# Patient Record
Sex: Female | Born: 1935 | Race: Black or African American | Hispanic: No | Marital: Single | Smoking: Former smoker
Health system: Southern US, Community
[De-identification: ages and names within clinical notes are randomized; demographics above are authoritative.]

## PROBLEM LIST (undated history)

## (undated) DIAGNOSIS — I509 Heart failure, unspecified: Secondary | ICD-10-CM

## (undated) DIAGNOSIS — I10 Essential (primary) hypertension: Secondary | ICD-10-CM

## (undated) HISTORY — PX: TONSILLECTOMY: SUR1361

## (undated) HISTORY — PX: ABDOMINAL HYSTERECTOMY: SHX81

---

## 2013-04-10 ENCOUNTER — Inpatient Hospital Stay (HOSPITAL_COMMUNITY)
Admission: EM | Admit: 2013-04-10 | Discharge: 2013-04-16 | DRG: 432 | Disposition: A | Discharge status: Hospice | Payer: Medicare Other | Attending: Family Medicine | Admitting: Family Medicine

## 2013-04-10 ENCOUNTER — Encounter (HOSPITAL_COMMUNITY): Payer: Self-pay | Admitting: Emergency Medicine

## 2013-04-10 DIAGNOSIS — G9340 Encephalopathy, unspecified: Secondary | ICD-10-CM

## 2013-04-10 DIAGNOSIS — I4891 Unspecified atrial fibrillation: Secondary | ICD-10-CM | POA: Diagnosis not present

## 2013-04-10 DIAGNOSIS — F29 Unspecified psychosis not due to a substance or known physiological condition: Secondary | ICD-10-CM | POA: Diagnosis present

## 2013-04-10 DIAGNOSIS — I4729 Other ventricular tachycardia: Secondary | ICD-10-CM | POA: Diagnosis not present

## 2013-04-10 DIAGNOSIS — I509 Heart failure, unspecified: Secondary | ICD-10-CM | POA: Diagnosis present

## 2013-04-10 DIAGNOSIS — M129 Arthropathy, unspecified: Secondary | ICD-10-CM | POA: Diagnosis present

## 2013-04-10 DIAGNOSIS — Z515 Encounter for palliative care: Secondary | ICD-10-CM

## 2013-04-10 DIAGNOSIS — R627 Adult failure to thrive: Secondary | ICD-10-CM | POA: Diagnosis present

## 2013-04-10 DIAGNOSIS — I959 Hypotension, unspecified: Secondary | ICD-10-CM

## 2013-04-10 DIAGNOSIS — K7460 Unspecified cirrhosis of liver: Principal | ICD-10-CM | POA: Diagnosis present

## 2013-04-10 DIAGNOSIS — K7682 Hepatic encephalopathy: Secondary | ICD-10-CM | POA: Diagnosis present

## 2013-04-10 DIAGNOSIS — E860 Dehydration: Secondary | ICD-10-CM | POA: Diagnosis present

## 2013-04-10 DIAGNOSIS — R109 Unspecified abdominal pain: Secondary | ICD-10-CM

## 2013-04-10 DIAGNOSIS — K56609 Unspecified intestinal obstruction, unspecified as to partial versus complete obstruction: Secondary | ICD-10-CM

## 2013-04-10 DIAGNOSIS — E872 Acidosis, unspecified: Secondary | ICD-10-CM | POA: Diagnosis present

## 2013-04-10 DIAGNOSIS — N179 Acute kidney failure, unspecified: Secondary | ICD-10-CM | POA: Diagnosis present

## 2013-04-10 DIAGNOSIS — D649 Anemia, unspecified: Secondary | ICD-10-CM | POA: Diagnosis present

## 2013-04-10 DIAGNOSIS — Z66 Do not resuscitate: Secondary | ICD-10-CM | POA: Diagnosis not present

## 2013-04-10 DIAGNOSIS — K7290 Hepatic failure, unspecified without coma: Secondary | ICD-10-CM | POA: Diagnosis present

## 2013-04-10 DIAGNOSIS — N189 Chronic kidney disease, unspecified: Secondary | ICD-10-CM | POA: Diagnosis present

## 2013-04-10 DIAGNOSIS — K560 Paralytic ileus: Secondary | ICD-10-CM | POA: Diagnosis present

## 2013-04-10 DIAGNOSIS — Z7401 Bed confinement status: Secondary | ICD-10-CM

## 2013-04-10 DIAGNOSIS — Z79899 Other long term (current) drug therapy: Secondary | ICD-10-CM

## 2013-04-10 DIAGNOSIS — K567 Ileus, unspecified: Secondary | ICD-10-CM

## 2013-04-10 DIAGNOSIS — Z95828 Presence of other vascular implants and grafts: Secondary | ICD-10-CM

## 2013-04-10 DIAGNOSIS — Z7902 Long term (current) use of antithrombotics/antiplatelets: Secondary | ICD-10-CM

## 2013-04-10 DIAGNOSIS — I472 Ventricular tachycardia, unspecified: Secondary | ICD-10-CM | POA: Diagnosis not present

## 2013-04-10 DIAGNOSIS — E8809 Other disorders of plasma-protein metabolism, not elsewhere classified: Secondary | ICD-10-CM | POA: Diagnosis present

## 2013-04-10 DIAGNOSIS — R32 Unspecified urinary incontinence: Secondary | ICD-10-CM | POA: Diagnosis present

## 2013-04-10 DIAGNOSIS — R188 Other ascites: Secondary | ICD-10-CM | POA: Diagnosis present

## 2013-04-10 DIAGNOSIS — E119 Type 2 diabetes mellitus without complications: Secondary | ICD-10-CM | POA: Diagnosis present

## 2013-04-10 DIAGNOSIS — I319 Disease of pericardium, unspecified: Secondary | ICD-10-CM | POA: Diagnosis present

## 2013-04-10 DIAGNOSIS — I129 Hypertensive chronic kidney disease with stage 1 through stage 4 chronic kidney disease, or unspecified chronic kidney disease: Secondary | ICD-10-CM | POA: Diagnosis present

## 2013-04-10 DIAGNOSIS — R739 Hyperglycemia, unspecified: Secondary | ICD-10-CM | POA: Diagnosis present

## 2013-04-10 HISTORY — DX: Heart failure, unspecified: I50.9

## 2013-04-10 HISTORY — DX: Essential (primary) hypertension: I10

## 2013-04-10 LAB — I-STAT TROPONIN, ED: Troponin i, poc: 0.02 ng/mL (ref 0.00–0.08)

## 2013-04-10 MED ORDER — FENTANYL CITRATE 0.05 MG/ML IJ SOLN
50.0000 ug | Freq: Once | INTRAMUSCULAR | Status: AC
  Administered 2013-04-10: 50 ug via INTRAVENOUS
  Filled 2013-04-10: qty 2

## 2013-04-10 MED ORDER — ONDANSETRON HCL 4 MG/2ML IJ SOLN
4.0000 mg | Freq: Once | INTRAMUSCULAR | Status: AC
  Administered 2013-04-10: 4 mg via INTRAVENOUS
  Filled 2013-04-10: qty 2

## 2013-04-10 NOTE — ED Notes (Signed)
EMS reports pt has had abd pain all day and now has had nausea and vomiting x 2 this evening. Pt c/o abd distention and pain rated at 3

## 2013-04-10 NOTE — ED Notes (Signed)
Pt unable to state month or day of week. Family states this is not normal for pt.  Also c/o urinary frequency.  Legs and lower legs have pitting edema.

## 2013-04-11 ENCOUNTER — Inpatient Hospital Stay (HOSPITAL_COMMUNITY): Payer: Medicare Other

## 2013-04-11 ENCOUNTER — Encounter (HOSPITAL_COMMUNITY): Payer: Self-pay | Admitting: *Deleted

## 2013-04-11 ENCOUNTER — Emergency Department (HOSPITAL_COMMUNITY): Payer: Medicare Other

## 2013-04-11 DIAGNOSIS — E872 Acidosis, unspecified: Secondary | ICD-10-CM | POA: Diagnosis present

## 2013-04-11 DIAGNOSIS — I509 Heart failure, unspecified: Secondary | ICD-10-CM

## 2013-04-11 DIAGNOSIS — K560 Paralytic ileus: Secondary | ICD-10-CM

## 2013-04-11 DIAGNOSIS — K56609 Unspecified intestinal obstruction, unspecified as to partial versus complete obstruction: Secondary | ICD-10-CM

## 2013-04-11 DIAGNOSIS — Z95828 Presence of other vascular implants and grafts: Secondary | ICD-10-CM

## 2013-04-11 DIAGNOSIS — D649 Anemia, unspecified: Secondary | ICD-10-CM | POA: Diagnosis present

## 2013-04-11 DIAGNOSIS — R739 Hyperglycemia, unspecified: Secondary | ICD-10-CM | POA: Diagnosis present

## 2013-04-11 DIAGNOSIS — G9340 Encephalopathy, unspecified: Secondary | ICD-10-CM | POA: Diagnosis present

## 2013-04-11 DIAGNOSIS — I319 Disease of pericardium, unspecified: Secondary | ICD-10-CM

## 2013-04-11 DIAGNOSIS — K567 Ileus, unspecified: Secondary | ICD-10-CM | POA: Diagnosis present

## 2013-04-11 DIAGNOSIS — I959 Hypotension, unspecified: Secondary | ICD-10-CM

## 2013-04-11 DIAGNOSIS — N179 Acute kidney failure, unspecified: Secondary | ICD-10-CM | POA: Diagnosis present

## 2013-04-11 DIAGNOSIS — K7460 Unspecified cirrhosis of liver: Secondary | ICD-10-CM | POA: Diagnosis present

## 2013-04-11 DIAGNOSIS — E860 Dehydration: Secondary | ICD-10-CM

## 2013-04-11 DIAGNOSIS — R109 Unspecified abdominal pain: Secondary | ICD-10-CM | POA: Diagnosis present

## 2013-04-11 LAB — PROTIME-INR
INR: 1.04 (ref 0.00–1.49)
Prothrombin Time: 13.4 seconds (ref 11.6–15.2)

## 2013-04-11 LAB — COMPREHENSIVE METABOLIC PANEL
ALBUMIN: 2.9 g/dL — AB (ref 3.5–5.2)
ALBUMIN: 1.8 g/dL — AB (ref 3.5–5.2)
ALK PHOS: 46 U/L (ref 39–117)
ALT: 6 U/L (ref 0–35)
ALT: 7 U/L (ref 0–35)
AST: 12 U/L (ref 0–37)
AST: 12 U/L (ref 0–37)
Alkaline Phosphatase: 62 U/L (ref 39–117)
BILIRUBIN TOTAL: 0.3 mg/dL (ref 0.3–1.2)
BUN: 44 mg/dL — ABNORMAL HIGH (ref 6–23)
BUN: 53 mg/dL — AB (ref 6–23)
CALCIUM: 8.5 mg/dL (ref 8.4–10.5)
CHLORIDE: 102 meq/L (ref 96–112)
CO2: 18 meq/L — AB (ref 19–32)
CO2: 20 mEq/L (ref 19–32)
CREATININE: 4.39 mg/dL — AB (ref 0.50–1.10)
Calcium: 9.5 mg/dL (ref 8.4–10.5)
Chloride: 104 mEq/L (ref 96–112)
Creatinine, Ser: 3.62 mg/dL — ABNORMAL HIGH (ref 0.50–1.10)
GFR calc Af Amer: 12 mL/min — ABNORMAL LOW (ref >90)
GFR calc Af Amer: 9 mL/min — ABNORMAL LOW (ref >90)
GFR calc non Af Amer: 8 mL/min — ABNORMAL LOW (ref >90)
GFR, EST NON AFRICAN AMERICAN: 10 mL/min — AB (ref >90)
Glucose, Bld: 191 mg/dL — ABNORMAL HIGH (ref 70–99)
Glucose, Bld: 155 mg/dL — ABNORMAL HIGH (ref 70–99)
Potassium: 4.2 mEq/L (ref 3.7–5.3)
Potassium: 5 mEq/L (ref 3.7–5.3)
SODIUM: 140 meq/L (ref 137–147)
Sodium: 141 mEq/L (ref 137–147)
TOTAL PROTEIN: 4.9 g/dL — AB (ref 6.0–8.3)
Total Bilirubin: <0.2 mg/dL — ABNORMAL LOW (ref 0.3–1.2)
Total Protein: 6.7 g/dL (ref 6.0–8.3)

## 2013-04-11 LAB — URINE MICROSCOPIC-ADD ON

## 2013-04-11 LAB — URINALYSIS, ROUTINE W REFLEX MICROSCOPIC
BILIRUBIN URINE: NEGATIVE
BILIRUBIN URINE: NEGATIVE
GLUCOSE, UA: 250 mg/dL — AB
Glucose, UA: 250 mg/dL — AB
KETONES UR: 15 mg/dL — AB
Ketones, ur: 15 mg/dL — AB
LEUKOCYTES UA: NEGATIVE
LEUKOCYTES UA: NEGATIVE
Nitrite: NEGATIVE
Nitrite: NEGATIVE
PH: 6 (ref 5.0–8.0)
PH: 6 (ref 5.0–8.0)
Protein, ur: >300 mg/dL — AB
Specific Gravity, Urine: 1.031 — ABNORMAL HIGH (ref 1.005–1.030)
Specific Gravity, Urine: 1.03 (ref 1.005–1.030)
Urobilinogen, UA: 0.2 mg/dL (ref 0.0–1.0)
Urobilinogen, UA: 0.2 mg/dL (ref 0.0–1.0)

## 2013-04-11 LAB — BODY FLUID CELL COUNT WITH DIFFERENTIAL
Eos, Fluid: 1 %
Lymphs, Fluid: 10 %
MONOCYTE-MACROPHAGE-SEROUS FLUID: 26 % — AB (ref 50–90)
Neutrophil Count, Fluid: 63 % — ABNORMAL HIGH (ref 0–25)
WBC FLUID: 38 uL (ref 0–1000)

## 2013-04-11 LAB — CBC
HCT: 30.3 % — ABNORMAL LOW (ref 36.0–46.0)
Hemoglobin: 10.5 g/dL — ABNORMAL LOW (ref 12.0–15.0)
MCH: 30.3 pg (ref 26.0–34.0)
MCHC: 34.7 g/dL (ref 30.0–36.0)
MCV: 87.3 fL (ref 78.0–100.0)
PLATELETS: 348 10*3/uL (ref 150–400)
RBC: 3.47 MIL/uL — ABNORMAL LOW (ref 3.87–5.11)
RDW: 14.4 % (ref 11.5–15.5)
WBC: 8.1 10*3/uL (ref 4.0–10.5)

## 2013-04-11 LAB — I-STAT CHEM 8, ED
BUN: 39 mg/dL — AB (ref 6–23)
CALCIUM ION: 1.18 mmol/L (ref 1.13–1.30)
CREATININE: 4.2 mg/dL — AB (ref 0.50–1.10)
Chloride: 108 mEq/L (ref 96–112)
GLUCOSE: 191 mg/dL — AB (ref 70–99)
HCT: 31 % — ABNORMAL LOW (ref 36.0–46.0)
Hemoglobin: 10.5 g/dL — ABNORMAL LOW (ref 12.0–15.0)
POTASSIUM: 4.1 meq/L (ref 3.7–5.3)
Sodium: 142 mEq/L (ref 137–147)
TCO2: 19 mmol/L (ref 0–100)

## 2013-04-11 LAB — PROTEIN, BODY FLUID: Total protein, fluid: 1.5 g/dL

## 2013-04-11 LAB — STREP PNEUMONIAE URINARY ANTIGEN: Strep Pneumo Urinary Antigen: NEGATIVE

## 2013-04-11 LAB — CREATININE, URINE, RANDOM: Creatinine, Urine: 139.21 mg/dL

## 2013-04-11 LAB — OSMOLALITY, URINE: Osmolality, Ur: 399 mOsm/kg (ref 390–1090)

## 2013-04-11 LAB — INFLUENZA PANEL BY PCR (TYPE A & B)
H1N1FLUPCR: NOT DETECTED
INFLBPCR: NEGATIVE
Influenza A By PCR: NEGATIVE

## 2013-04-11 LAB — GLUCOSE, CAPILLARY
GLUCOSE-CAPILLARY: 139 mg/dL — AB (ref 70–99)
GLUCOSE-CAPILLARY: 146 mg/dL — AB (ref 70–99)
Glucose-Capillary: 172 mg/dL — ABNORMAL HIGH (ref 70–99)
Glucose-Capillary: 143 mg/dL — ABNORMAL HIGH (ref 70–99)

## 2013-04-11 LAB — APTT: APTT: 26 s (ref 24–37)

## 2013-04-11 LAB — HEPATITIS PANEL, ACUTE
HCV Ab: NEGATIVE
HEP A IGM: NONREACTIVE
HEP B C IGM: NONREACTIVE
HEP B S AG: NEGATIVE

## 2013-04-11 LAB — I-STAT CG4 LACTIC ACID, ED: Lactic Acid, Venous: 1.32 mmol/L (ref 0.5–2.2)

## 2013-04-11 LAB — ALBUMIN, FLUID (OTHER): Albumin, Fluid: 0.8 g/dL

## 2013-04-11 LAB — SODIUM, URINE, RANDOM: SODIUM UR: 41 meq/L

## 2013-04-11 LAB — LACTATE DEHYDROGENASE, PLEURAL OR PERITONEAL FLUID: LD FL: 91 U/L — AB (ref 3–23)

## 2013-04-11 LAB — AMMONIA: Ammonia: 27 umol/L (ref 11–60)

## 2013-04-11 LAB — PRO B NATRIURETIC PEPTIDE: PRO B NATRI PEPTIDE: 4937 pg/mL — AB (ref 0–450)

## 2013-04-11 LAB — LIPASE, BLOOD: Lipase: 15 U/L (ref 11–59)

## 2013-04-11 LAB — OSMOLALITY: Osmolality: 313 mOsm/kg — ABNORMAL HIGH (ref 275–300)

## 2013-04-11 LAB — MRSA PCR SCREENING: MRSA BY PCR: NEGATIVE

## 2013-04-11 LAB — PROCALCITONIN: Procalcitonin: 1.44 ng/mL

## 2013-04-11 MED ORDER — SODIUM CHLORIDE 0.9 % IJ SOLN
3.0000 mL | Freq: Two times a day (BID) | INTRAMUSCULAR | Status: DC
  Administered 2013-04-11 – 2013-04-12 (×3): 3 mL via INTRAVENOUS
  Administered 2013-04-13: 10 mL via INTRAVENOUS
  Administered 2013-04-13 – 2013-04-14 (×2): 3 mL via INTRAVENOUS

## 2013-04-11 MED ORDER — SODIUM CHLORIDE 0.9 % IV SOLN
INTRAVENOUS | Status: DC
  Administered 2013-04-12: 10 mL/h via INTRAVENOUS

## 2013-04-11 MED ORDER — SODIUM CHLORIDE 0.9 % IV SOLN
INTRAVENOUS | Status: DC
  Administered 2013-04-11: 16:00:00 via INTRAVENOUS

## 2013-04-11 MED ORDER — SODIUM CHLORIDE 0.9 % IV BOLUS (SEPSIS)
500.0000 mL | Freq: Once | INTRAVENOUS | Status: AC
  Administered 2013-04-11: 500 mL via INTRAVENOUS

## 2013-04-11 MED ORDER — DEXTROSE 5 % IV SOLN
1.0000 g | INTRAVENOUS | Status: DC
  Administered 2013-04-11: 1 g via INTRAVENOUS
  Filled 2013-04-11 (×2): qty 10

## 2013-04-11 MED ORDER — ONDANSETRON HCL 4 MG/2ML IJ SOLN
4.0000 mg | Freq: Once | INTRAMUSCULAR | Status: AC
  Administered 2013-04-11: 4 mg via INTRAVENOUS

## 2013-04-11 MED ORDER — PANTOPRAZOLE SODIUM 40 MG IV SOLR
40.0000 mg | INTRAVENOUS | Status: DC
  Administered 2013-04-11 – 2013-04-14 (×4): 40 mg via INTRAVENOUS
  Filled 2013-04-11 (×3): qty 40

## 2013-04-11 MED ORDER — PANTOPRAZOLE SODIUM 40 MG IV SOLR: 40.0000 mg | Freq: Two times a day (BID) | INTRAVENOUS | Status: DC

## 2013-04-11 MED ORDER — INSULIN ASPART 100 UNIT/ML ~~LOC~~ SOLN: 0.0000 [IU] | Freq: Every day | SUBCUTANEOUS | Status: DC

## 2013-04-11 MED ORDER — INSULIN ASPART 100 UNIT/ML ~~LOC~~ SOLN
0.0000 [IU] | Freq: Three times a day (TID) | SUBCUTANEOUS | Status: DC
  Administered 2013-04-12 – 2013-04-13 (×4): 1 [IU] via SUBCUTANEOUS

## 2013-04-11 MED ORDER — IOHEXOL 300 MG/ML  SOLN
20.0000 mL | INTRAMUSCULAR | Status: AC
  Administered 2013-04-11: 25 mL via ORAL

## 2013-04-11 MED ORDER — ONDANSETRON HCL 4 MG/2ML IJ SOLN
INTRAMUSCULAR | Status: AC
  Administered 2013-04-11: 4 mg via INTRAVENOUS
  Filled 2013-04-11: qty 2

## 2013-04-11 MED ORDER — ONDANSETRON HCL 4 MG/2ML IJ SOLN
4.0000 mg | Freq: Four times a day (QID) | INTRAMUSCULAR | Status: DC | PRN
Start: 2013-04-11 — End: 2013-04-16
  Administered 2013-04-13: 4 mg via INTRAVENOUS
  Filled 2013-04-11: qty 2

## 2013-04-11 MED ORDER — LACTULOSE ENEMA
300.0000 mL | Freq: Two times a day (BID) | ORAL | Status: DC
  Administered 2013-04-11: 300 mL via RECTAL
  Filled 2013-04-11 (×3): qty 300

## 2013-04-11 MED ORDER — LACTULOSE 10 GM/15ML PO SOLN
30.0000 g | Freq: Three times a day (TID) | ORAL | Status: DC
  Administered 2013-04-11 (×3): 30 g via ORAL
  Filled 2013-04-11 (×6): qty 45

## 2013-04-11 MED ORDER — HEPARIN SODIUM (PORCINE) 5000 UNIT/ML IJ SOLN
5000.0000 [IU] | Freq: Three times a day (TID) | INTRAMUSCULAR | Status: DC
  Administered 2013-04-11 – 2013-04-14 (×10): 5000 [IU] via SUBCUTANEOUS
  Filled 2013-04-11 (×14): qty 1

## 2013-04-11 MED ORDER — ONDANSETRON HCL 4 MG PO TABS: 4.0000 mg | ORAL_TABLET | Freq: Four times a day (QID) | ORAL | Status: DC | PRN

## 2013-04-11 NOTE — ED Provider Notes (Signed)
CSN: [id number]     Arrival date & time 04/10/13  2214 History   First MD Initiated Contact with Patient 04/10/13 2301     Chief Complaint  Patient presents with  . Abdominal Pain     (Consider location/radiation/quality/duration/timing/severity/associated sxs/prior Treatment) HPI 80 year old female presents to emergency department from home with complaint of 2 days of abdominal pain, nausea and vomiting.  Patient reports pain is diffuse.  Family reports she has had confusion, and urinary incontinence, which is new.  She also has worsening swelling in her lower extremities.  She carries diagnoses of CHF.  Family reports swelling in legs axis, and wanes, and is currently worse than normal.  No fever, chills, sick contacts, unusual foods, or travel. Past Medical History  Diagnosis Date  . CHF (congestive heart failure)   . Hypertension    Past Surgical History  Procedure Laterality Date  . Abdominal hysterectomy    . Tonsillectomy     No family history on file. History  Substance Use Topics  . Smoking status: Never Smoker   . Smokeless tobacco: Not on file  . Alcohol Use: No   OB History   Grav Para Term Preterm Abortions TAB SAB Ect Mult Living                 Review of Systems  Unable to perform ROS: Mental status change      Allergies  Review of patient's allergies indicates no known allergies.  Home Medications   Current Outpatient Rx  Name  Route  Sig  Dispense  Refill  . amLODipine (NORVASC) 10 MG tablet   Oral   Take 10 mg by mouth daily.         . carvedilol (COREG) 3.125 MG tablet   Oral   Take 3.125 mg by mouth 2 (two) times daily with a meal.         . clopidogrel (PLAVIX) 75 MG tablet   Oral   Take 75 mg by mouth daily with breakfast.         . docusate sodium (COLACE) 100 MG capsule   Oral   Take 300 mg by mouth at bedtime.         . folic acid (FOLVITE) 1 MG tablet   Oral   Take 1 mg by mouth daily.         . isosorbide  dinitrate (ISORDIL) 20 MG tablet   Oral   Take 20 mg by mouth every 6 (six) hours. Hold for SBP <110 or HR <60         . pantoprazole (PROTONIX) 40 MG tablet   Oral   Take 40 mg by mouth daily.         Marland Kitchen senna (SENOKOT) 8.6 MG TABS tablet   Oral   Take 1 tablet by mouth at bedtime.         . vitamin B-12 (CYANOCOBALAMIN) 1000 MCG tablet   Oral   Take 1,000 mcg by mouth daily.          BP 201/90  Pulse 91  Temp(Src) 98 F (36.7 C) (Oral)  Resp 22  SpO2 98% Physical Exam  Nursing note and vitals reviewed. Constitutional: She is oriented to person, place, and time. She appears well-developed and well-nourished. She appears distressed.  HENT:  Head: Normocephalic and atraumatic.  Nose: Nose normal.  Mouth/Throat: Oropharynx is clear and moist.  Eyes: Conjunctivae and EOM are normal. Pupils are equal, round, and reactive to light.  Neck: Normal range of motion. Neck supple. No JVD present. No tracheal deviation present. No thyromegaly present.  Cardiovascular: Normal rate, regular rhythm, normal heart sounds and intact distal pulses.  Exam reveals no gallop and no friction rub.   No murmur heard. Pulmonary/Chest: Effort normal and breath sounds normal. No stridor. No respiratory distress. She has no wheezes. She has no rales. She exhibits no tenderness.  Abdominal: Soft. Bowel sounds are normal. She exhibits no distension and no mass. There is tenderness (diffuse tenderness worse suprapubic). There is no rebound and no guarding.  Musculoskeletal: Normal range of motion. She exhibits edema (2+ to mid shin). She exhibits no tenderness.  Lymphadenopathy:    She has no cervical adenopathy.  Neurological: She is alert and oriented to person, place, and time. She exhibits normal muscle tone. Coordination normal.  Skin: Skin is warm and dry. No rash noted. No erythema. No pallor.  Psychiatric: She has a normal mood and affect. Her behavior is normal. Judgment and thought content  normal.    ED Course  Procedures (including critical care time)  Pt with difficult to obtain blood. Multiple attempts by phlebotomy, nursing staff, techs.  Using sterile technique, right groin was prepped with alcohol and chlorhexidine.  Using 23 needle, blood was withdrawn from right femoral vein.  Pt tolerated procedure well.    CRITICAL CARE Performed by: Olivia Mackie Total critical care time: 60 min Critical care time was exclusive of separately billable procedures and treating other patients. Critical care was necessary to treat or prevent imminent or life-threatening deterioration. Critical care was time spent personally by me on the following activities: development of treatment plan with patient and/or surrogate as well as nursing, discussions with consultants, evaluation of patient's response to treatment, examination of patient, obtaining history from patient or surrogate, ordering and performing treatments and interventions, ordering and review of laboratory studies, ordering and review of radiographic studies, pulse oximetry and re-evaluation of patient's condition.  Labs Review Labs Reviewed  CBC - Abnormal; Notable for the following:    RBC 3.47 (*)    Hemoglobin 10.5 (*)    HCT 30.3 (*)    All other components within normal limits  COMPREHENSIVE METABOLIC PANEL - Abnormal; Notable for the following:    CO2 18 (*)    Glucose, Bld 191 (*)    BUN 44 (*)    Creatinine, Ser 3.62 (*)    Albumin 2.9 (*)    GFR calc non Af Amer 10 (*)    GFR calc Af Amer 12 (*)    All other components within normal limits  URINALYSIS, ROUTINE W REFLEX MICROSCOPIC - Abnormal; Notable for the following:    APPearance CLOUDY (*)    Specific Gravity, Urine 1.031 (*)    Glucose, UA 250 (*)    Hgb urine dipstick SMALL (*)    Ketones, ur 15 (*)    Protein, ur >300 (*)    All other components within normal limits  PRO B NATRIURETIC PEPTIDE - Abnormal; Notable for the following:    Pro B  Natriuretic peptide (BNP) 4937.0 (*)    All other components within normal limits  URINE MICROSCOPIC-ADD ON - Abnormal; Notable for the following:    Casts HYALINE CASTS (*)    All other components within normal limits  GLUCOSE, CAPILLARY - Abnormal; Notable for the following:    Glucose-Capillary 172 (*)    All other components within normal limits  I-STAT CHEM 8, ED - Abnormal; Notable for the  following:    BUN 39 (*)    Creatinine, Ser 4.20 (*)    Glucose, Bld 191 (*)    Hemoglobin 10.5 (*)    HCT 31.0 (*)    All other components within normal limits  MRSA PCR SCREENING  BODY FLUID CULTURE  LIPASE, BLOOD  AMMONIA  PROTIME-INR  APTT  BODY FLUID CELL COUNT WITH DIFFERENTIAL  PROTEIN, BODY FLUID  LACTATE DEHYDROGENASE, BODY FLUID  ALBUMIN, FLUID  SODIUM, URINE, RANDOM  OSMOLALITY, URINE  OSMOLALITY  I-STAT TROPOININ, ED  I-STAT CG4 LACTIC ACID, ED  CYTOLOGY - NON PAP   Imaging Review Ct Abdomen Pelvis Wo Contrast  04/11/2013   CLINICAL DATA:  Abdominal pain  EXAM: CT ABDOMEN AND PELVIS WITHOUT CONTRAST  TECHNIQUE: Multidetector CT imaging of the abdomen and pelvis was performed following the standard protocol without intravenous contrast.  COMPARISON:  None.  FINDINGS: Bilateral pleural effusions and bibasilar atelectasis left greater than right. Moderately large pericardial effusion. Small hiatal hernia.  Small irregular liver consistent with cirrhosis. Large amount of ascites consistent with liver failure. There is diffuse anasarca.  Small bowel is moderately dilated suggestive of small bowel obstruction. The colon is decompressed. Negative for adenopathy. IVC filter noted extending into the hepatic IVC.  IMPRESSION: Bilateral pleural effusions. Pericardial effusion. Large amount of ascites.  Cirrhosis of the liver.  Small bowel obstruction.   Electronically Signed   By: Marlan Palauharles  Clark M.D.   On: 04/11/2013 03:02   Ct Head Wo Contrast  04/11/2013   CLINICAL DATA:  Altered  mental status  EXAM: CT HEAD WITHOUT CONTRAST  TECHNIQUE: Contiguous axial images were obtained from the base of the skull through the vertex without intravenous contrast.  COMPARISON:  None.  FINDINGS: Mild atrophy. Negative for hydrocephalus. Mild chronic microvascular ischemic change in the white matter.  Negative for acute infarct.  Negative for hemorrhage or mass.  NG tube in place. Paranasal sinuses are clear. No acute skull abnormality.  IMPRESSION: No acute abnormality.   Electronically Signed   By: Marlan Palauharles  Clark M.D.   On: 04/11/2013 06:46     EKG Interpretation   Date/Time:  Saturday April 10 2013 22:28:15 EDT Ventricular Rate:  93 PR Interval:  172 QRS Duration: 86 QT Interval:  385 QTC Calculation: 479 R Axis:   91 Text Interpretation:  Sinus rhythm Anterior infarct, old No old tracing to  compare Confirmed by Zacari Radick  MD, Carolanne Mercier ([id number]) on 04/10/2013 11:33:49 PM      MDM   Final diagnoses:  SBO (small bowel obstruction)  Acute kidney failure  CHF (congestive heart failure)  Dehydration  Anemia  Cirrhosis of liver with ascites    80 year old female with 2 days of abdominal pain, nausea, vomiting.  Plan for labs, UA.  Suspect urinary tract infection.  Given age and difficulty obtaining history and unreliable exam.  Will need CT scan.  Pt with renal failure, family reports no prior knowledge of same.  Pt has been in [place] for the last 2 months, prior to that time had been in [place].  Area hospital does not have staff to allow us to receive records at this time.  Pt reportedly being seen by Dr Algie CofferKadakia.    CT scan showing cirrhosis, ascites, and SBO.  Case discussed with hospitalist, To be admitted to stepdown unit.   Olivia Mackielga M Nathania Waldman, MD 04/11/13 0830

## 2013-04-11 NOTE — H&P (Signed)
[facility] History and Physical  Patient: Anna Preston  WUJ:[id number]  DOB: Jun 11, 1923  DOS: the patient was seen and examined on 04/11/2013 PCP: No primary provider on file.  Chief Complaint: abdominal pain  HPI: Inara Dike is a 80 y.o. female with Past medical history of CHF, hypertension and diabtes. The patient is coming from home. The patient was brought in by family. The patient is a resident at [facility] [place], she was recently admitted for pneumonia at [facility], after that she was sent to a rehabilitation facility. Since patient's daughter there was unable to provide care for the patient the patient was moved to [place] to live with her family here. Patient was brought in because she was complaining of abdominal pain associated with nausea and one day of vomiting. The abdominal pain was described as diffuse and sharp in nature. The patient appears poor historian and was lethargic on my evaluation therefore much of the history was obtained from the family. Since the patient is recently transferred from [place] to hear, the family does not have much information about her past history and past medical history. Since last few days the patient has on and off swelling of her legs and abdomen. Family denies any fever chills cough chest pain shortness of breath diarrhea constipation active bleeding melena. Family reports patient has been urinating well. Patient has been compliant with her medication as per the family. Since last one week she has been acting confused and is not answering questions appropriately. There is no auditory or visual hallucination. The family reports that the patient is bed bound for nearly 2 years due to arthritis and uses a bedside commode. Family denies any prior awareness of liver disease, kidney disease, anemia.  Review of Systems: as mentioned in the history of present illness.  A Comprehensive review of the other systems is  negative.  Past Medical History  Diagnosis Date  . CHF (congestive heart failure)   . Hypertension    Past Surgical History  Procedure Laterality Date  . Abdominal hysterectomy    . Tonsillectomy     Social History:  reports that she has never smoked. She does not have any smokeless tobacco history on file. She reports that she does not drink alcohol or use illicit drugs. Independent for most of her  ADL.  No Known Allergies  No family history on file.  Prior to Admission medications   Medication Sig Start Date End Date Taking? Authorizing Provider  amLODipine (NORVASC) 10 MG tablet Take 10 mg by mouth daily.   Yes Historical Provider, MD  carvedilol (COREG) 3.125 MG tablet Take 3.125 mg by mouth 2 (two) times daily with a meal.   Yes Historical Provider, MD  clopidogrel (PLAVIX) 75 MG tablet Take 75 mg by mouth daily with breakfast.   Yes Historical Provider, MD  docusate sodium (COLACE) 100 MG capsule Take 300 mg by mouth at bedtime.   Yes Historical Provider, MD  folic acid (FOLVITE) 1 MG tablet Take 1 mg by mouth daily.   Yes Historical Provider, MD  isosorbide dinitrate (ISORDIL) 20 MG tablet Take 20 mg by mouth every 6 (six) hours. Hold for SBP <110 or HR <60   Yes Historical Provider, MD  pantoprazole (PROTONIX) 40 MG tablet Take 40 mg by mouth daily.   Yes Historical Provider, MD  senna (SENOKOT) 8.6 MG TABS tablet Take 1 tablet by mouth at bedtime.   Yes Historical Provider, MD  vitamin B-12 (CYANOCOBALAMIN) 1000  MCG tablet Take 1,000 mcg by mouth daily.   Yes Historical Provider, MD    Physical Exam: Filed Vitals:   04/10/13 2234 04/11/13 0000 04/11/13 0215 04/11/13 0415  BP: 201/90 173/86 175/107 149/86  Pulse:  89 90 103  Temp: 98 F (36.7 C)   97.5 F (36.4 C)  TempSrc: Oral   Oral  Resp: 22 16 32 21  SpO2: 98% 97% 97% 97%    General: Alert, Awake and Oriented to Place and Person. Appear in mild distress Eyes: PERRL ENT: Oral Mucosa clear moist. Neck:  difficult to assess JVD due to non-cooperation in exam Cardiovascular: S1 and S2 Present, no Murmur, Peripheral Pulses Present Respiratory: Bilateral Air entry equal and Decreased, Clear to Auscultation,  no Crackles,no wheezes Abdomen: Bowel Sound Present, Soft and mildly diffusely tender, no guarding or rigidity, distended abdomen Skin: no Rash Extremities: bilateral Pedal edema, no calf tenderness Neurologic: Mental status lethargic, Cranial Nerves PERRL, Motor strength moving extremities spontaneously, Sensation withdraw to pain, reflexes muted in ankle, babinski negative.  Labs on Admission:  CBC:  Recent Labs Lab 04/11/13 0156 04/11/13 0205  WBC 8.1  --   HGB 10.5* 10.5*  HCT 30.3* 31.0*  MCV 87.3  --   PLT 348  --     CMP     Component Value Date/Time   NA 142 04/11/2013 0205   K 4.1 04/11/2013 0205   CL 108 04/11/2013 0205   CO2 18* 04/11/2013 0156   GLUCOSE 191* 04/11/2013 0205   BUN 39* 04/11/2013 0205   CREATININE 4.20* 04/11/2013 0205   CALCIUM 9.5 04/11/2013 0156   PROT 6.7 04/11/2013 0156   ALBUMIN 2.9* 04/11/2013 0156   AST 12 04/11/2013 0156   ALT 6 04/11/2013 0156   ALKPHOS 62 04/11/2013 0156   BILITOT 0.3 04/11/2013 0156   GFRNONAA 10* 04/11/2013 0156   GFRAA 12* 04/11/2013 0156     Recent Labs Lab 04/11/13 0156  LIPASE 15   No results found for this basename: AMMONIA,  in the last 168 hours  No results found for this basename: CKTOTAL, CKMB, CKMBINDEX, TROPONINI,  in the last 168 hours BNP (last 3 results)  Recent Labs  04/11/13 0156  PROBNP 4937.0*    Radiological Exams on Admission: Ct Abdomen Pelvis Wo Contrast  04/11/2013   CLINICAL DATA:  Abdominal pain  EXAM: CT ABDOMEN AND PELVIS WITHOUT CONTRAST  TECHNIQUE: Multidetector CT imaging of the abdomen and pelvis was performed following the standard protocol without intravenous contrast.  COMPARISON:  None.  FINDINGS: Bilateral pleural effusions and bibasilar atelectasis left greater than right.  Moderately large pericardial effusion. Small hiatal hernia.  Small irregular liver consistent with cirrhosis. Large amount of ascites consistent with liver failure. There is diffuse anasarca.  Small bowel is moderately dilated suggestive of small bowel obstruction. The colon is decompressed. Negative for adenopathy. IVC filter noted extending into the hepatic IVC.  IMPRESSION: Bilateral pleural effusions. Pericardial effusion. Large amount of ascites.  Cirrhosis of the liver.  Small bowel obstruction.   Electronically Signed   By: Marlan Palauharles  Clark M.D.   On: 04/11/2013 03:02    Assessment/Plan Principal Problem:   Cirrhosis Active Problems:   Acute renal failure   Anemia   Metabolic acidosis   Hyperglycemia   Acute encephalopathy   Abdominal pain, unspecified site   SBO (small bowel obstruction)   1. Cirrhosis The patient is presenting with complaints of generalized swelling and abdominal pain. A CT scan of the abdomen is  showing cirrhosis of the liver along with that she has bilateral pleural effusion, pericardial effusion, ascites and she also appears to have anasarca. At this point etiology of cirrhosis is unclear. Due to her ascites and abdominal pain I will check ammonia level, INR. Her altered mental status can be explained by the hepatic encephalopathy for which I will start her on lactulose enema. I will treat her with IV ceftriaxone for possible SBP prophylaxis. Ultrasound guided present as is will be ordered. Further workup regarding the etiology of the cirrhosis I would hold off on any I can get records from [facility] in morning, at this point we're unable to get any information.  2. Small bowel obstruction CT scan is showing dilated small bowel without any transition point with decompressed colon. I discussed the case with on-call radiologist needed At this point the patient has NG tube inserted and I will keep her n.p.o. IV Protonix and IV Zofran as needed I would hold  off her oral medications Continue to monitor electrolytes  3. Hyperglycemia Patient has history of diabetes mellitus as per the family but she's not on any medications. I will place her on sensitive sliding scale.  4. Acute kidney injury/chronic kidney disease The patient has worsening of her serum creatinine, along with that she has metabolic acidosis. CT of the abdomen does not show any significant kidney abnormality. Most likely her current presentation is acute on chronic kidney disease but further documentation needed prior to further workup. I will insert a Foley catheter and monitor her ins and outs. Monitor BMP and avoid nephrotoxic medications  DVT Prophylaxis: subcutaneous Heparin Nutrition:  n.p.o.  Code Status:  the family is unclear off goals of care at this point, they want to discuss with the rest of the family members. At present patient is full code   Family Communication:  family  was present at bedside, opportunity was given to ask question and all questions were answered satisfactorily at the time of interview. Disposition: Admitted to inpatient in step-down unit.  Author: Lynden Oxford, MD [facility] Pager: [phone] 04/11/2013, 5:03 AM    If 7PM-7AM, please contact night-coverage [email or web address] Password [email or web address]

## 2013-04-11 NOTE — Progress Notes (Signed)
UR completed 

## 2013-04-11 NOTE — Procedures (Signed)
Central Venous Catheter Insertion Procedure Note Anna RoyalsSusie Preston [id number] [birth year]  Procedure: Insertion of Central Venous Catheter Indications: Assessment of intravascular volume, Drug and/or fluid administration and Frequent blood sampling  Procedure Details Consent: Risks of procedure as well as the alternatives and risks of each were explained to the (patient/caregiver).  Consent for procedure obtained. Time Out: Verified patient identification, verified procedure, site/side was marked, verified correct patient position, special equipment/implants available, medications/allergies/relevent history reviewed, required imaging and test results available.  Performed  Maximum sterile technique was used including antiseptics, cap, gloves, gown, hand hygiene, mask and sheet. Skin prep: Chlorhexidine; local anesthetic administered A antimicrobial bonded/coated triple lumen catheter was placed in the right internal jugular vein using the Seldinger technique. Ultrasound guidance used.yes Catheter placed to 17 cm. Blood aspirated via all 3 ports and then flushed x 3. Line sutured x 2 and dressing applied.  Evaluation Blood flow good Complications: No apparent complications Patient did tolerate procedure well. Chest X-ray ordered to verify placement.  CXR: pending.  Brett CanalesSteve Raelin Pixler ACNP [facility] PCCM Pager [phone] till 3 pm If no answer page [phone] 04/11/2013, 10:14 AM

## 2013-04-11 NOTE — Progress Notes (Signed)
  Subjective: Patient examined, echocardiogram and CT scan is reviewed. Family not present during the exam. 80 year old female admitted with abdominal pain and vomiting, lethargy and altered mental status. She was found to be in acute renal failure with creatinine greater than 4. CT scan of abdomen and lower chest demonstrated large amount of ascites with a small contracted liver consistent with cirrhosis  , pericardial effusion, and a loculated left pleural effusion  Echocardiogram demonstrated a moderate pericardial effusion. She had LVH with myocardial enhancement consistent with amyloid. Patient was not tachycardic and clinically does not have tamponade-she presented with blood pressure 200 and her CVP is 4. Vital signs in last 24 hours: Temp:  [97.5 F (36.4 C)-98 F (36.7 C)] 97.5 F (36.4 C) (03/15 1600) Pulse Rate:  [81-103] 83 (03/15 1800) Cardiac Rhythm:  [-]  Resp:  [16-32] 21 (03/15 1800) BP: (98-201)/(52-107) 131/77 mmHg (03/15 1800) SpO2:  [97 %-100 %] 97 % (03/15 1800) Weight:  [136 lb 7.4 oz (61.9 kg)] 136 lb 7.4 oz (61.9 kg) (03/15 0530)  Hemodynamic parameters for last 24 hours: CVP:  [4 mmHg] 4 mmHg  Intake/Output from previous day: 03/14 0701 - 03/15 0700 In: 1000 [I.V.:1000] Out: -  Intake/Output this shift:    Exam Patient is minimally responsive and appears moribund Heart sounds are regular without murmur Breath sounds are diminished  Lab Results:  Recent Labs  04/11/13 0156 04/11/13 0205  WBC 8.1  --   HGB 10.5* 10.5*  HCT 30.3* 31.0*  PLT 348  --    BMET:  Recent Labs  04/11/13 0156 04/11/13 0205 04/11/13 1600  NA 140 142 141  K 4.2 4.1 5.0  CL 102 108 104  CO2 18*  --  20  GLUCOSE 191* 191* 155*  BUN 44* 39* 53*  CREATININE 3.62* 4.20* 4.39*  CALCIUM 9.5  --  8.5    PT/INR:  Recent Labs  04/11/13 1125  LABPROT 13.4  INR 1.04   ABG    Component Value Date/Time   TCO2 19 04/11/2013 0205   CBG (last 3)   Recent Labs  04/11/13 0758 04/11/13 1215 04/11/13 1705  GLUCAP 172* 139* 146*    Assessment/Plan: S/P   Patient has multisystem failure and appears to be in stage. She's not a candidate for subxiphoid pericardial window under general anesthesia. The pericardial effusion is probably chronic. If the decision is made for aggressive care pericardial centesis would be the only option but comfort  measures appear to be more appropriate.   LOS: 1 day    VAN TRIGT III,PETER 04/11/2013

## 2013-04-11 NOTE — Progress Notes (Signed)
  Echocardiogram 2D Echocardiogram has been performed.  Cathie BeamsGREGORY, Willson Lipa 04/11/2013, 2:28 PM

## 2013-04-11 NOTE — Procedures (Signed)
Supervised procedure earlier in the day  Real time 2D ultrasound used for vein site selection, patency assessment, and needle entry./ A record of image was made but could not be submitted for filing due to malfunction of printing device   Dr. Kalman ShanMurali Lycan Davee, M.D., [facility] Center[facility].C.P Pulmonary and Critical Care Medicine Staff Physician [facility] Pager: [phone], If no answer or between  15:00h - 7:00h: call [phone]  04/11/2013 3:30 PM

## 2013-04-11 NOTE — ED Notes (Signed)
MD at bedside. 

## 2013-04-11 NOTE — Consult Note (Signed)
PULMONARY / CRITICAL CARE MEDICINE   Name: Anna Preston MRN: [id number] DOB: 09-29-1923    ADMISSION DATE:  04/10/2013 CONSULTATION DATE:  3/15  REFERRING MD : [facility] PRIMARY SERVICE: [facility]  CHIEF COMPLAINT:  lethargy  BRIEF PATIENT DESCRIPTION:  80 yo AAF who has been living in [place] but due to declining health and complexity of care she was moved to family in [place]. Noted to have recent pna and hospitalized in [place]. Further hx of  Cirrhosis , ARF, SBO.IVC filter,anemia and general failure to thrive. Admitted by [facility] service 3/14, pccm asked to placed cvl due to no iv access. Due to complexity of her care pccm asked for formal consult 3/15.  SIGNIFICANT EVENTS / STUDIES:    LINES / TUBES: 3/15 rt i j cvl>>  CULTURES: 3/15 uc>> 3/15 bc>> 3/15 procal>> 3/15 flu panel>> ANTIBIOTICS: 3/14 roc>>  HISTORY OF PRESENT ILLNESS:   80 yo AAF who has been living in [place] but due to declining health and complexity of care she was moved to family in [place]. Noted to have recent pna and hospitalized in [place]. Further hx of  Cirrhosis , ARF, SBO.IVC filter,anemia and general failure to thrive. Admitted by [facility] service 3/14, pccm asked to placed cvl due to no iv access. Due to complexity of her care pccm asked for formal consult 3/15.  PAST MEDICAL HISTORY :  Past Medical History  Diagnosis Date  . CHF (congestive heart failure)   . Hypertension    Past Surgical History  Procedure Laterality Date  . Abdominal hysterectomy    . Tonsillectomy     Prior to Admission medications   Medication Sig Start Date End Date Taking? Authorizing Provider  amLODipine (NORVASC) 10 MG tablet Take 10 mg by mouth daily.   Yes Historical Provider, MD  carvedilol (COREG) 3.125 MG tablet Take 3.125 mg by mouth 2 (two) times daily with a meal.   Yes Historical Provider, MD  clopidogrel (PLAVIX) 75 MG tablet Take 75 mg by mouth daily with breakfast.   Yes Historical Provider, MD  docusate sodium (COLACE) 100  MG capsule Take 300 mg by mouth at bedtime.   Yes Historical Provider, MD  folic acid (FOLVITE) 1 MG tablet Take 1 mg by mouth daily.   Yes Historical Provider, MD  isosorbide dinitrate (ISORDIL) 20 MG tablet Take 20 mg by mouth every 6 (six) hours. Hold for SBP <110 or HR <60   Yes Historical Provider, MD  pantoprazole (PROTONIX) 40 MG tablet Take 40 mg by mouth daily.   Yes Historical Provider, MD  senna (SENOKOT) 8.6 MG TABS tablet Take 1 tablet by mouth at bedtime.   Yes Historical Provider, MD  vitamin B-12 (CYANOCOBALAMIN) 1000 MCG tablet Take 1,000 mcg by mouth daily.   Yes Historical Provider, MD   No Known Allergies  FAMILY HISTORY:  No family history on file. SOCIAL HISTORY:  reports that she has quit smoking. She does not have any smokeless tobacco history on file. She reports that she does not drink alcohol or use illicit drugs.  REVIEW OF SYSTEMS:  na  SUBJECTIVE:   VITAL SIGNS: Temp:  [97.5 F (36.4 C)-98 F (36.7 C)] 97.6 F (36.4 C) (03/15 0530) Pulse Rate:  [87-103] 89 (03/15 0700) Resp:  [16-32] 20 (03/15 0700) BP: (122-201)/(76-107) 124/79 mmHg (03/15 0700) SpO2:  [97 %-100 %] 100 % (03/15 0700) Weight:  [61.9 kg (136 lb 7.4 oz)] 61.9 kg (136 lb 7.4 oz) (03/15 0530) HEMODYNAMICS:   VENTILATOR  SETTINGS:   INTAKE / OUTPUT: Intake/Output     03/14 0701 - 03/15 0700 03/15 0701 - 03/16 0700   I.V. (mL/kg) 1000 (16.2)    Total Intake(mL/kg) 1000 (16.2)    Net +1000            PHYSICAL EXAMINATION: General:  Elderly lethargic female Neuro:  MAEx 4, poorly follows commands HEENT: No JVD/LAN Cardiovascular: HSD Lungs:  Decreased bs bases Abdomen:  +anasarca  Musculoskeletal:  intact Skin: warm  LABS:  PULMONARY  Recent Labs Lab 04/11/13 0205  TCO2 19    CBC  Recent Labs Lab 04/11/13 0156 04/11/13 0205  HGB 10.5* 10.5*  HCT 30.3* 31.0*  WBC 8.1  --   PLT 348  --     COAGULATION  Recent Labs Lab 04/11/13 1125  INR 1.04     CARDIAC  No results found for this basename: TROPONINI,  in the last 168 hours  Recent Labs Lab 04/11/13 0156  PROBNP 4937.0*     CHEMISTRY  Recent Labs Lab 04/11/13 0156 04/11/13 0205  NA 140 142  K 4.2 4.1  CL 102 108  CO2 18*  --   GLUCOSE 191* 191*  BUN 44* 39*  CREATININE 3.62* 4.20*  CALCIUM 9.5  --    Estimated Creatinine Clearance: 8 ml/min (by C-G formula based on Cr of 4.2).   LIVER  Recent Labs Lab 04/11/13 0156 04/11/13 1125  AST 12  --   ALT 6  --   ALKPHOS 62  --   BILITOT 0.3  --   PROT 6.7  --   ALBUMIN 2.9*  --   INR  --  1.04     INFECTIOUS  Recent Labs Lab 04/11/13 0204  LATICACIDVEN 1.32     ENDOCRINE CBG (last 3)   Recent Labs  04/11/13 0758 04/11/13 1215  GLUCAP 172* 139*         IMAGING x48h  Ct Abdomen Pelvis Wo Contrast  04/11/2013   CLINICAL DATA:  Abdominal pain  EXAM: CT ABDOMEN AND PELVIS WITHOUT CONTRAST  TECHNIQUE: Multidetector CT imaging of the abdomen and pelvis was performed following the standard protocol without intravenous contrast.  COMPARISON:  None.  FINDINGS: Bilateral pleural effusions and bibasilar atelectasis left greater than right. Moderately large pericardial effusion. Small hiatal hernia.  Small irregular liver consistent with cirrhosis. Large amount of ascites consistent with liver failure. There is diffuse anasarca.  Small bowel is moderately dilated suggestive of small bowel obstruction. The colon is decompressed. Negative for adenopathy. IVC filter noted extending into the hepatic IVC.  IMPRESSION: Bilateral pleural effusions. Pericardial effusion. Large amount of ascites.  Cirrhosis of the liver.  Small bowel obstruction.   Electronically Signed   By: Marlan Palau M.D.   On: 04/11/2013 03:02   Ct Head Wo Contrast  04/11/2013   CLINICAL DATA:  Altered mental status  EXAM: CT HEAD WITHOUT CONTRAST  TECHNIQUE: Contiguous axial images were obtained from the base of the skull through the  vertex without intravenous contrast.  COMPARISON:  None.  FINDINGS: Mild atrophy. Negative for hydrocephalus. Mild chronic microvascular ischemic change in the white matter.  Negative for acute infarct.  Negative for hemorrhage or mass.  NG tube in place. Paranasal sinuses are clear. No acute skull abnormality.  IMPRESSION: No acute abnormality.   Electronically Signed   By: Marlan Palau M.D.   On: 04/11/2013 06:46   US Paracentesis  04/11/2013   CLINICAL DATA:  Abdominal ascites  EXAM: ULTRASOUND  GUIDED left lower quadrant PARACENTESIS; PORTABLE  COMPARISON:  None.  PROCEDURE: An ultrasound guided paracentesis was thoroughly discussed with the patient and questions answered. The benefits, risks, alternatives and complications were also discussed. The patient understands and wishes to proceed with the procedure. Written consent was obtained.  Ultrasound was performed to localize and mark an adequate pocket of fluid in the left lower quadrant of the abdomen. The area was then prepped and draped in the normal sterile fashion. 1% Lidocaine was used for local anesthesia. Under ultrasound guidance a 19 gauge Yueh catheter was introduced. Paracentesis was performed. The catheter was removed and a dressing applied.  Complications: None.  FINDINGS: A total of approximately 180 cc of blood-tinged fluid was removed. A fluid sample was sent for laboratory analysis.  IMPRESSION: Successful ultrasound guided portable paracentesis yielding 180 cc of ascites.  Read by: Beckey Downing [facility]   Electronically Signed   By: Malachy Moan M.D.   On: 04/11/2013 11:04   Dg Chest Port 1 View  04/11/2013   CLINICAL DATA:  Right IJ central line placement  EXAM: PORTABLE CHEST - 1 VIEW  COMPARISON:  None.  FINDINGS: A right IJ central venous catheter is present. The tip is in good position at the superior cavoatrial junction. There is dense opacification of the left lung base as well as right-to-left shift of the cardiac and mediastinal  structures. The degree of shift is likely slightly exaggerated by leftward rotation of the patient. Layering left pleural effusion. The right lung is clear. No pneumothorax. A nasogastric tube is present. The tip lies below the field of view presumably within the stomach. An IVC filter is identified in the right upper quadrant. Suspect super of renal location. Enlarged cardiopericardial silhouette.  IMPRESSION: 1. Left pleural effusion and associated left basilar atelectasis. The resultant volume loss results in right to left shift of the cardiac and mediastinal structures. 2. The tip of the right IJ central venous catheter projects over the superior cavoatrial junction. 3. The tip of the nasogastric tube lies off the field of view and is presumably within the stomach. 4. Probable suprarenal IVC filter.   Electronically Signed   By: Malachy Moan M.D.   On: 04/11/2013 11:06      ASSESSMENT / PLAN:  PULMONARY A: No acute issue. Recent pna P:   O2 as needed ro viral   CARDIOVASCULAR A:  Pericardial effusion Transient hypotension - resolved with fluids P:  2 d echo to evaluate CVP q 4h  RENAL A:  ARF P:   Hydrate and reassess (she seems very dry on exam and on [place] via central line; STAFF MD Comment) Check bladder scan Renal US in future  GASTROINTESTINAL A:  Anasarca, cirrohsis P:   Paracentesis Ammonia level LFT's Coags  NGT NPO  HEMATOLOGIC A:  Need lab work to address P:  Monitor - PRBC for hgb </= 6.9gm%    - exceptions are   -  if ACS susepcted/confirmed then transfuse for hgb </= 8.0gm%,  or    -   active bleeding with hemodynamic instability, then transfuse regardless of hemoglobin value   At at all times try to transfuse 1 unit prbc as possible with exception of active hemorrhage     INFECTIOUS A: No acute source of infection noted P:   IV Roc per [facility] for prophylaxis   ENDOCRINE A: Hyper glycemia P:   ssi  NEUROLOGIC A: Lethargy presumed from  metabolic state P:   Consider CT of  head if she does not improve with  tx of cirrhosis. Consider checking ammonia level  GLOBAL No family at bedside. Monitor in SDU   GodfreySteve Minor ACNP [facility] Pager [phone] till 3 pm If no answer page [phone] 04/11/2013, 11:15 AM   STAFF NOTE: I, Dr Lavinia SharpsM Maddon Horton have personally reviewed patient's available data, including medical history, events of note, physical examination and test results as part of my evaluation. I have discussed with resident/NP and other care providers such as pharmacist, RN and RRT.  In addition,  I personally evaluated patient and elicited key findings of hypotension without IV access needing CVL. She seems very dehdyrated. Would fluid resus and reassess bp and creatinine. Consider echo for pericardial effusion. Overall she is frail and debilitated; consider palliative care consult for goals of care  Rest per NP/medical resident whose note is outlined above and that I agree with     Dr. Kalman ShanMurali Tyneisha Hegeman, M.D., [facility] Pulmonary and Critical Care Medicine Staff Physician [facility] Pager: [phone], If no answer or between  15:00h - 7:00h: call [phone]  04/11/2013 3:36 PM

## 2013-04-11 NOTE — Progress Notes (Addendum)
[facility] Progress Note Anna Preston - Stepdown/ICU TEAM   Anna Preston:[id number] DOB: 1923-08-11 DOA: 04/10/2013 PCP: No primary provider on file.  Brief narrative: Anna Preston is a 80 y.o. female presenting on 04/10/2013 with  has a past medical history of CHF (congestive heart failure) and Hypertension who presents with the patient was brought in by family. The patient is a resident at [place], she was recently admitted for pneumonia at [facility], after that she was sent to a rehabilitation facility. Since patient's daughter there was unable to provide care for the patient the patient was moved to [place] to live with her family here.  Patient was brought in because she was complaining of abdominal pain associated with nausea and one day of vomiting. The abdominal pain was described as diffuse and sharp in nature.  Since the patient is recently transferred from [place] to [place], the family does not have much information about her past history and past medical history.  For the last few days the patient has on and off swelling of her legs and abdomen. Family denies any fever chills cough chest pain shortness of breath diarrhea constipation active bleeding melena.  Family reports patient has been urinating well. Patient has been compliant with her medication. There are no auditory or visual hallucinations.  The family reports that the patient is bed bound for nearly 2 years due to arthritis and is only able to transfer.   Family denies any prior awareness of liver disease, kidney disease, anemia.  Subjective: Sleepy.   Assessment/Plan: Principal Problem:   Abdominal pain - ascitic fluid negative for infection  Active Problems:   Acute renal failure--  Metabolic acidosis-- ATN and prerenal? - granular casts and proteinuria  - FeNa 0.9%- Prerenal- Hepatorenal? - kidney visualized on CT abdomen and pelvis- no mention of abnormalities - cont to  hydrate today and follow in AM-     Cirrhosis/ ascites - per daughter pt did have blood transfusions in the 80s but has no h/o of alcohol or drug abuse - suspect ascites will worsening with hyration- follow closely - obtain hepatitis panel - surprisingly coags and platelets normal.    pleural effusion - unable to diurese- this is due to hypoalbuminemia   "moderately-large" pericardial effusion on CT - Stat ECHO-     Anemia - check anemia profile and stool occults    Acute encephalopathy - suspected elevated ammonia level- results pending    Ileus - NG tube    S/P IVC filter    Code Status: full code Family Communication:  With daughter- Anna Preston Disposition Plan: follow in SDU  Consultants: PCCM  Procedures: Diagnostic paracentesis Central line  Antibiotics: Antibiotics Given (last 72 hours)   Date/Time Action Medication Dose Rate   04/11/13 0732 Given   cefTRIAXone (ROCEPHIN) Preston g in dextrose 5 % 50 mL IVPB Preston g 100 mL/hr       DVT prophylaxis: Heparin  Objective: Filed Weights   04/11/13 0530  Weight: 61.9 kg (136 lb 7.4 oz)   Blood pressure 118/74, pulse 89, temperature 97.6 F (36.4 C), temperature source Axillary, resp. rate 27, height 5\' 2"  (Preston.575 m), weight 61.9 kg (136 lb 7.4 oz), SpO2 100.00%.  Intake/Output Summary (Last 24 hours) at 04/11/13 1507 Last data filed at 04/11/13 1300  Gross per 24 hour  Intake   1060 ml  Output    250 ml  Net    810 ml  Exam: General: No acute respiratory distress- lethargic, nonverbal Lungs: decreased breath sounds Cardiovascular: Regular rate and rhythm without murmur gallop or rub normal S1 and S2 Abdomen: Nontender, quite distended but soft, bowel sounds negative, no rebound, no ascites, no appreciable mass Extremities: No significant cyanosis, clubbing, or edema bilateral lower extremities  Data Reviewed: Basic Metabolic Panel:  Recent Labs Lab 04/11/13 0156 04/11/13 0205  NA 140 142   K 4.2 4.Preston  CL 102 108  CO2 18*  --   GLUCOSE 191* 191*  BUN 44* 39*  CREATININE 3.62* 4.20*  CALCIUM 9.5  --    Liver Function Tests:  Recent Labs Lab 04/11/13 0156  AST 12  ALT 6  ALKPHOS 62  BILITOT 0.3  PROT 6.7  ALBUMIN 2.9*    Recent Labs Lab 04/11/13 0156  LIPASE 15   No results found for this basename: AMMONIA,  in the last 168 hours CBC:  Recent Labs Lab 04/11/13 0156 04/11/13 0205  WBC 8.Preston  --   HGB 10.5* 10.5*  HCT 30.3* 31.0*  MCV 87.3  --   PLT 348  --    Cardiac Enzymes: No results found for this basename: CKTOTAL, CKMB, CKMBINDEX, TROPONINI,  in the last 168 hours BNP (last 3 results)  Recent Labs  04/11/13 0156  PROBNP 4937.0*   CBG:  Recent Labs Lab 04/11/13 0758 04/11/13 1215  GLUCAP 172* 139*    Recent Results (from the past 240 hour(s))  MRSA PCR SCREENING     Status: None   Collection Time    04/11/13  5:26 AM      Result Value Ref Range Status   MRSA by PCR NEGATIVE  NEGATIVE Final   Comment:            The GeneXpert MRSA Assay (FDA     approved for NASAL specimens     only), is one component of a     comprehensive MRSA colonization     surveillance program. It is not     intended to diagnose MRSA     infection nor to guide or     monitor treatment for     MRSA infections.     Studies:  Recent x-ray studies have been reviewed in detail by the Attending Physician  Scheduled Meds:  Scheduled Meds: . cefTRIAXone (ROCEPHIN)  IV  Preston g Intravenous Q24H  . heparin  5,000 Units Subcutaneous 3 times per day  . insulin aspart  0-5 Units Subcutaneous QHS  . insulin aspart  0-9 Units Subcutaneous TID WC  . lactulose  30 g Oral TID  . pantoprazole (PROTONIX) IV  40 mg Intravenous Q24H  . sodium chloride  3 mL Intravenous Q12H   Continuous Infusions: . sodium chloride      Time spent on care of this patient: >35 min   Anna CantorIZWAN,Anna Barefield, MD  [facility] Office  [phone] Pager - Text Page per Loretha StaplerAmion as per  below:  On-Call/Text Page:      [email or web address]  If 7PM-7AM, please contact night-coverage [email or web address] 04/11/2013, 3:07 PM   LOS: Preston day

## 2013-04-12 ENCOUNTER — Inpatient Hospital Stay (HOSPITAL_COMMUNITY): Payer: Medicare Other

## 2013-04-12 LAB — BASIC METABOLIC PANEL
BUN: 57 mg/dL — ABNORMAL HIGH (ref 6–23)
CHLORIDE: 111 meq/L (ref 96–112)
CO2: 19 mEq/L (ref 19–32)
Calcium: 7.6 mg/dL — ABNORMAL LOW (ref 8.4–10.5)
Creatinine, Ser: 4.75 mg/dL — ABNORMAL HIGH (ref 0.50–1.10)
GFR calc non Af Amer: 7 mL/min — ABNORMAL LOW (ref >90)
GFR, EST AFRICAN AMERICAN: 9 mL/min — AB (ref >90)
Glucose, Bld: 140 mg/dL — ABNORMAL HIGH (ref 70–99)
POTASSIUM: 4.6 meq/L (ref 3.7–5.3)
SODIUM: 145 meq/L (ref 137–147)

## 2013-04-12 LAB — CBC
HCT: 31.2 % — ABNORMAL LOW (ref 36.0–46.0)
HEMOGLOBIN: 10.8 g/dL — AB (ref 12.0–15.0)
MCH: 30.1 pg (ref 26.0–34.0)
MCHC: 34.6 g/dL (ref 30.0–36.0)
MCV: 86.9 fL (ref 78.0–100.0)
PLATELETS: 313 10*3/uL (ref 150–400)
RBC: 3.59 MIL/uL — AB (ref 3.87–5.11)
RDW: 14.9 % (ref 11.5–15.5)
WBC: 12.8 10*3/uL — AB (ref 4.0–10.5)

## 2013-04-12 LAB — GLUCOSE, CAPILLARY
GLUCOSE-CAPILLARY: 135 mg/dL — AB (ref 70–99)
GLUCOSE-CAPILLARY: 129 mg/dL — AB (ref 70–99)
GLUCOSE-CAPILLARY: 136 mg/dL — AB (ref 70–99)
GLUCOSE-CAPILLARY: 134 mg/dL — AB (ref 70–99)

## 2013-04-12 LAB — LEGIONELLA ANTIGEN, URINE: Legionella Antigen, Urine: NEGATIVE

## 2013-04-12 LAB — PROCALCITONIN: Procalcitonin: 1.89 ng/mL

## 2013-04-12 LAB — ANA: Anti Nuclear Antibody(ANA): NEGATIVE

## 2013-04-12 LAB — AMMONIA: AMMONIA: 26 umol/L (ref 11–60)

## 2013-04-12 MED ORDER — LABETALOL HCL 5 MG/ML IV SOLN
5.0000 mg | INTRAVENOUS | Status: DC | PRN
  Administered 2013-04-12 – 2013-04-13 (×3): 5 mg via INTRAVENOUS
  Filled 2013-04-12 (×3): qty 4

## 2013-04-12 MED ORDER — METOCLOPRAMIDE HCL 5 MG/ML IJ SOLN
5.0000 mg | Freq: Two times a day (BID) | INTRAMUSCULAR | Status: DC
  Administered 2013-04-12 – 2013-04-14 (×5): 5 mg via INTRAVENOUS
  Filled 2013-04-12 (×6): qty 1

## 2013-04-12 MED ORDER — SODIUM CHLORIDE 0.9 % IV SOLN
INTRAVENOUS | Status: DC
  Administered 2013-04-12: 100 mL/h via INTRAVENOUS
  Administered 2013-04-12 – 2013-04-14 (×3): via INTRAVENOUS

## 2013-04-12 MED ORDER — IOHEXOL 300 MG/ML  SOLN: 25.0000 mL | INTRAMUSCULAR | Status: DC

## 2013-04-12 MED ORDER — FLEET ENEMA 7-19 GM/118ML RE ENEM: 1.0000 | ENEMA | Freq: Every day | RECTAL | Status: DC

## 2013-04-12 NOTE — Progress Notes (Signed)
INITIAL NUTRITION ASSESSMENT  DOCUMENTATION CODES Per approved criteria  -Not Applicable   INTERVENTION: -Recommend that pt be seen by SLP. - If diet can be advanced, recommend Ensure Complete po BID, each supplement provides 350 kcal and 13 grams of protein. - If diet unable to be advanced, please consider use of TPN per pharmacy - RD will continue to monitor.   NUTRITION DIAGNOSIS: Inadequate oral intake related to inability to eat as evidenced by NPO.   Goal: Pt to meet >/= 90% of their estimated nutrition needs   Monitor:  Wt trends, SLP, diet advancement, GOC  Reason for Assessment: Low Braden  80 y.o. female  Admitting Dx: Abdominal pain, unspecified site  ASSESSMENT: 80 year old female admitted with abdominal pain and vomiting, lethargy and altered mental status. She was found to be in acute renal failure with creatinine greater than 4.  CT scan of abdomen and lower chest demonstrated large amount of ascites with a small contracted liver consistent with cirrhosis , pericardial effusion, and a loculated left pleural effusion Echocardiogram demonstrated a moderate pericardial effusion.  -Pt was unresponsive during RD visit. Per RN, pt is very lethargic. Pt had been bed bound for ~ 2 years from arthritis. -Pt with Ileus and has a NG tube placed.  Height: Ht Readings from Last 1 Encounters:  04/11/13 5\' 2"  (1.575 m)    Weight: Wt Readings from Last 1 Encounters:  04/12/13 140 lb 14 oz (63.9 kg)    Ideal Body Weight: 50.1 kg  % Ideal Body Weight: 128%  Wt Readings from Last 10 Encounters:  04/12/13 140 lb 14 oz (63.9 kg)    Usual Body Weight: unknown  % Usual Body Weight: unknown  BMI:  Body mass index is 25.76 kg/(m^2).  Estimated Nutritional Needs: Kcal: 1450-1600 Protein: 80-90 g Fluid: >1.6 L/day  Skin: incision on abdomen  Diet Order: NPO  EDUCATION NEEDS: -Education not appropriate at this time   Intake/Output Summary (Last 24 hours) at  04/12/13 1410 Last data filed at 04/12/13 0900  Gross per 24 hour  Intake 1209.67 ml  Output    192 ml  Net 1017.67 ml    Last BM: 3/15   Labs:   Recent Labs Lab 04/11/13 0156 04/11/13 0205 04/11/13 1600  NA 140 142 141  K 4.2 4.1 5.0  CL 102 108 104  CO2 18*  --  20  BUN 44* 39* 53*  CREATININE 3.62* 4.20* 4.39*  CALCIUM 9.5  --  8.5  GLUCOSE 191* 191* 155*    CBG (last 3)   Recent Labs  04/11/13 2234 04/12/13 0745 04/12/13 1210  GLUCAP 143* 135* 129*    Scheduled Meds: . heparin  5,000 Units Subcutaneous 3 times per day  . insulin aspart  0-5 Units Subcutaneous QHS  . insulin aspart  0-9 Units Subcutaneous TID WC  . pantoprazole (PROTONIX) IV  40 mg Intravenous Q24H  . sodium chloride  3 mL Intravenous Q12H    Continuous Infusions: . sodium chloride 10 mL/hr (04/12/13 0002)  . sodium chloride Stopped (04/11/13 2357)  . sodium chloride 100 mL/hr (04/12/13 1129)    Past Medical History  Diagnosis Date  . CHF (congestive heart failure)   . Hypertension     Past Surgical History  Procedure Laterality Date  . Abdominal hysterectomy    . Tonsillectomy      Ebbie LatusHaley Hawkins RD, LDN

## 2013-04-12 NOTE — Progress Notes (Signed)
[facility] Progress Note [place] TEAM 1 - Stepdown/ICU TEAM   [place] JWJ:[id number] DOB: 1923-11-13 DOA: 04/10/2013 PCP: No primary provider on file.  Brief narrative: Anna Preston is a 80 y.o. female presenting on 04/10/2013 with  has a past medical history of CHF (congestive heart failure) and Hypertension who presents with the patient was brought in by family. The patient is a resident at [facility] [place], she was recently admitted for pneumonia at [facility], after that she was sent to a rehabilitation facility. Since patient's daughter there was unable to provide care for the patient the patient was moved to [place] to live with her family here.  Patient was brought in because she was complaining of abdominal pain associated with nausea and one day of vomiting. The abdominal pain was described as diffuse and sharp in nature.  Since the patient is recently transferred from [place] to [facility], the family does not have much information about her past history and past medical history.  For the last few days the patient has on and off swelling of her legs and abdomen. Family denies any fever chills cough chest pain shortness of breath diarrhea constipation active bleeding melena.  Family reports patient has been urinating well. Patient has been compliant with her medication. There are no auditory or visual hallucinations.  The family reports that the patient is bed bound for nearly 2 years due to arthritis and is only able to transfer.   Family denies any prior awareness of liver disease, kidney disease, anemia.  Subjective: Sleepy. Non communicative  Assessment/Plan: Principal Problem:   Abdominal pain - ascitic fluid negative for infection- still quite tender despite not being overdistended with fluid  Active Problems:   Acute renal failure--  Metabolic acidosis-- ATN and prerenal? - CVP 5 - granular casts and proteinuria  - FeNa 0.9%- Prerenal-  Hepatorenal? - kidney visualized on CT abdomen and pelvis- no mention of abnormalities - cont to hydrate- poor urine output- abdomen does not seem more distended yet  Acute encephalopathy - interestingly Ammonia levels normal - stopped lactulose    Cirrhosis/ ascites - per daughter, pt did have blood transfusions in the 80s but has no h/o of alcohol or drug abuse - suspect ascites will worsening with hyration- follow closely - ANA and hepatitis panel non-reactive - surprisingly coags and platelets normal.    pleural effusion - unable to diurese- this is due to hypoalbuminemia   "moderately-large" pericardial effusion on CT - Stat ECHO does reveal some tamponade features but BP has improved significantly and pt not a candidate for pericardial window per CT surgery. Hold off on pericardiocentesis for now. Dr Zenaida Niece Tright suspects this effusion is chronic.     Anemia - check anemia profile and stool occults    Acute encephalopathy - suspected elevated ammonia level- results pending    Ileus - NG tube- repeat xray does not reveal it to be resolving- try enema and reglan    S/P IVC filter    Code Status: full code Family Communication:  With daughter- [name] Disposition Plan: follow in SDU  Consultants: PCCM  Procedures: Diagnostic paracentesis Central line  Antibiotics: Antibiotics Given (last 72 hours)   Date/Time Action Medication Dose Rate   04/11/13 0732 Given   cefTRIAXone (ROCEPHIN) 1 g in dextrose 5 % 50 mL IVPB 1 g 100 mL/hr       DVT prophylaxis: Heparin  Objective: Filed Weights   04/11/13 0530 04/12/13 0313  Weight:  61.9 kg (136 lb 7.4 oz) 63.9 kg (140 lb 14 oz)   Blood pressure 171/63, pulse 79, temperature 98.1 F (36.7 C), temperature source Oral, resp. rate 20, height 5\' 2"  (1.575 m), weight 63.9 kg (140 lb 14 oz), SpO2 98.00%.  Intake/Output Summary (Last 24 hours) at 04/12/13 1411 Last data filed at 04/12/13 0900  Gross per 24 hour   Intake 1209.67 ml  Output    192 ml  Net 1017.67 ml     Exam: General: No acute respiratory distress- sleepy but opens eyes, nonverbal Lungs: decreased breath sounds Cardiovascular: Regular rate and rhythm without murmur gallop or rub normal S1 and S2 Abdomen: Nontender, quite distended but soft, bowel sounds negative, no rebound, no ascites, no appreciable mass Extremities: No significant cyanosis, clubbing, or edema bilateral lower extremities  Data Reviewed: Basic Metabolic Panel:  Recent Labs Lab 04/11/13 0156 04/11/13 0205 04/11/13 1600  NA 140 142 141  K 4.2 4.1 5.0  CL 102 108 104  CO2 18*  --  20  GLUCOSE 191* 191* 155*  BUN 44* 39* 53*  CREATININE 3.62* 4.20* 4.39*  CALCIUM 9.5  --  8.5   Liver Function Tests:  Recent Labs Lab 04/11/13 0156 04/11/13 1600  AST 12 12  ALT 6 7  ALKPHOS 62 46  BILITOT 0.3 <0.2*  PROT 6.7 4.9*  ALBUMIN 2.9* 1.8*    Recent Labs Lab 04/11/13 0156  LIPASE 15    Recent Labs Lab 04/11/13 1815 04/12/13 0405  AMMONIA 27 26   CBC:  Recent Labs Lab 04/11/13 0156 04/11/13 0205 04/12/13 0405  WBC 8.1  --  12.8*  HGB 10.5* 10.5* 10.8*  HCT 30.3* 31.0* 31.2*  MCV 87.3  --  86.9  PLT 348  --  313   Cardiac Enzymes: No results found for this basename: CKTOTAL, CKMB, CKMBINDEX, TROPONINI,  in the last 168 hours BNP (last 3 results)  Recent Labs  04/11/13 0156  PROBNP 4937.0*   CBG:  Recent Labs Lab 04/11/13 1215 04/11/13 1705 04/11/13 2234 04/12/13 0745 04/12/13 1210  GLUCAP 139* 146* 143* 135* 129*    Recent Results (from the past 240 hour(s))  MRSA PCR SCREENING     Status: None   Collection Time    04/11/13  5:26 AM      Result Value Ref Range Status   MRSA by PCR NEGATIVE  NEGATIVE Final   Comment:            The GeneXpert MRSA Assay (FDA     approved for NASAL specimens     only), is one component of a     comprehensive MRSA colonization     surveillance program. It is not     intended  to diagnose MRSA     infection nor to guide or     monitor treatment for     MRSA infections.  BODY FLUID CULTURE     Status: None   Collection Time    04/11/13 10:37 AM      Result Value Ref Range Status   Specimen Description ASCITIC   Final   Special Requests NONE   Final   Gram Stain     Final   Value: NO WBC SEEN     NO ORGANISMS SEEN     Performed at [facility]   Culture     Final   Value: NO GROWTH 1 DAY     Performed at [facility]   Report  Status PENDING   Incomplete  CULTURE, BLOOD (ROUTINE X 2)     Status: None   Collection Time    04/11/13 12:30 PM      Result Value Ref Range Status   Specimen Description BLOOD CENTRAL LINE RIGHT JJ   Final   Special Requests BOTTLES DRAWN AEROBIC AND ANAEROBIC 5CC   Final   Culture  Setup Time     Final   Value: 04/11/2013 18:16     [facility]formed at [facility]   Culture     Final   Value:        BLOOD CULTURE RECEIVED NO GROWTH TO DATE CULTURE WILL BE HELD FOR 5 DAYS BEFORE ISSUING A FINAL NEGATIVE REPORT     [facility]formed at [facility]   Report Status PENDING   Incomplete     Studies:  Recent x-ray studies have been reviewed in detail by the Attending Physician  Scheduled Meds:  Scheduled Meds: . heparin  5,000 Units Subcutaneous 3 times per day  . insulin aspart  0-5 Units Subcutaneous QHS  . insulin aspart  0-9 Units Subcutaneous TID WC  . pantoprazole (PROTONIX) IV  40 mg Intravenous Q24H  . sodium chloride  3 mL Intravenous Q12H   Continuous Infusions: . sodium chloride 10 mL/hr (04/12/13 0002)  . sodium chloride Stopped (04/11/13 2357)  . sodium chloride 100 mL/hr (04/12/13 1129)    Time spent on care of this patient: >35 min   Calvert CantorIZWAN,Arianna Delsanto, MD  [facility] Office  [phone] Pager - Text Page per Loretha StaplerAmion as per below:  On-Call/Text Page:      [email or web address]  If 7PM-7AM, please contact night-coverage [email or web address] 04/12/2013, 2:11 PM   LOS: 2 days

## 2013-04-12 NOTE — Progress Notes (Signed)
Clinical Social Work Department BRIEF PSYCHOSOCIAL ASSESSMENT 04/12/2013  Patient:  Anna Preston,Anna Preston     Account Number:  [id number]     Admit date:  04/10/2013  Clinical Social Worker:  Anna Preston,Anna Preston, LCSWA  Date/Time:  04/12/2013 04:41 PM  Referred by:  Physician  Date Referred:  04/12/2013 Referred for  ALF Placement   Other Referral:   Interview type:  Family Other interview type:   Called pt's daughter Anna Preston as pt is disoriented x4 per chart and was sleeping when I went to bedside.    PSYCHOSOCIAL DATA Living Status:  FAMILY Admitted from facility:   Level of care:   Primary support name:  Anna Preston ([phone]) Primary support relationship to patient:  CHILD, ADULT Degree of support available:   Good--pt has extensive support from daughter and assorted family members.    CURRENT CONCERNS Current Concerns  Post-Acute Placement   Other Concerns:    SOCIAL WORK ASSESSMENT / PLAN I was consulted for assisted living referral--I called pt's daughter Anna Preston and explained consult and asked how I can help. Daughter explained she has been talking with other family members to try to figure out what facility would be a good fit for pt. I offered to leave a list of ALFs on pt's chart, and family can pick it up the next time they visit pt at the hospital. Daughter Anna Preston said this would be appreciated, and I put a list on her chart with a note with my name and phone number inviting family to call with any questions/if I can help further.   Assessment/plan status:  Psychosocial Support/Ongoing Assessment of Needs Other assessment/ plan:   Information/referral to community resources:   ALF list provided on pt's chart for family    PATIENT'S/FAMILY'S RESPONSE TO PLAN OF CARE: Good--pt's daughter Anna Preston engaged in conversation briefly with me, explaining she is working with other family members to figure out what is best concerning placement for pt. I [facility] chart and [name]/family will ask the RN to check the chart and provide the list of ALFs the next time someone visits. I also left my contact information inviting family to call me with questions or if I can help further.       Anna Preston, MSW, [facility]WA Clinical Social Worker [phone]

## 2013-04-13 LAB — BASIC METABOLIC PANEL
BUN: 66 mg/dL — ABNORMAL HIGH (ref 6–23)
CALCIUM: 8.3 mg/dL — AB (ref 8.4–10.5)
CO2: 20 meq/L (ref 19–32)
Chloride: 109 mEq/L (ref 96–112)
Creatinine, Ser: 5.38 mg/dL — ABNORMAL HIGH (ref 0.50–1.10)
GFR calc Af Amer: 7 mL/min — ABNORMAL LOW (ref >90)
GFR calc non Af Amer: 6 mL/min — ABNORMAL LOW (ref >90)
Glucose, Bld: 146 mg/dL — ABNORMAL HIGH (ref 70–99)
POTASSIUM: 4.8 meq/L (ref 3.7–5.3)
SODIUM: 145 meq/L (ref 137–147)

## 2013-04-13 LAB — GLUCOSE, CAPILLARY
Glucose-Capillary: 122 mg/dL — ABNORMAL HIGH (ref 70–99)
Glucose-Capillary: 106 mg/dL — ABNORMAL HIGH (ref 70–99)
Glucose-Capillary: 97 mg/dL (ref 70–99)
Glucose-Capillary: 116 mg/dL — ABNORMAL HIGH (ref 70–99)

## 2013-04-13 LAB — PROCALCITONIN: Procalcitonin: 1.42 ng/mL

## 2013-04-13 MED ORDER — METOPROLOL TARTRATE 1 MG/ML IV SOLN: 5.0000 mg | INTRAVENOUS | Status: DC

## 2013-04-13 MED ORDER — HYDRALAZINE HCL 20 MG/ML IJ SOLN
10.0000 mg | INTRAMUSCULAR | Status: DC
  Administered 2013-04-13 – 2013-04-14 (×4): 10 mg via INTRAVENOUS
  Filled 2013-04-13: qty 1
  Filled 2013-04-13 (×5): qty 0.5
  Filled 2013-04-13 (×2): qty 1
  Filled 2013-04-13: qty 0.5
  Filled 2013-04-13: qty 1

## 2013-04-13 MED ORDER — FUROSEMIDE 10 MG/ML IJ SOLN
80.0000 mg | Freq: Two times a day (BID) | INTRAMUSCULAR | Status: DC
  Administered 2013-04-13 – 2013-04-14 (×3): 80 mg via INTRAVENOUS
  Filled 2013-04-13 (×5): qty 8

## 2013-04-13 MED ORDER — CHLORHEXIDINE GLUCONATE 0.12 % MT SOLN
15.0000 mL | Freq: Two times a day (BID) | OROMUCOSAL | Status: DC
  Administered 2013-04-13 – 2013-04-14 (×3): 15 mL via OROMUCOSAL
  Filled 2013-04-13 (×3): qty 15

## 2013-04-13 MED ORDER — INSULIN ASPART 100 UNIT/ML ~~LOC~~ SOLN: 0.0000 [IU] | SUBCUTANEOUS | Status: DC

## 2013-04-13 MED ORDER — BIOTENE DRY MOUTH MT LIQD
15.0000 mL | Freq: Two times a day (BID) | OROMUCOSAL | Status: DC
  Administered 2013-04-13 (×2): 15 mL via OROMUCOSAL

## 2013-04-13 MED ORDER — METOPROLOL TARTRATE 1 MG/ML IV SOLN
5.0000 mg | Freq: Four times a day (QID) | INTRAVENOUS | Status: DC
  Administered 2013-04-13 – 2013-04-14 (×4): 5 mg via INTRAVENOUS
  Filled 2013-04-13 (×9): qty 5

## 2013-04-13 MED ORDER — IOHEXOL 300 MG/ML  SOLN
25.0000 mL | INTRAMUSCULAR | Status: AC
  Administered 2013-04-13 (×2): 25 mL via ORAL

## 2013-04-13 NOTE — Progress Notes (Signed)
MD paged about pt's persistent hypertension; awaiting page back; will continue to monitor closely and update as needed

## 2013-04-13 NOTE — Progress Notes (Addendum)
[facility] Progress Note [place] TEAM 1 - Stepdown/ICU TEAM   [place] ZOX:[id number] DOB: 1923-03-11 DOA: 04/10/2013 PCP: No primary provider on file.  Brief narrative: Anna Preston is a 80 y.o. female presenting on 04/10/2013 with  has a past medical history of CHF (congestive heart failure) and Hypertension who presents with the patient was brought in by family. The patient is a resident at [facility], she was recently admitted for pneumonia at [facility], after that she was sent to a rehabilitation facility. Since patient's daughter there was unable to provide care for the patient the patient was moved to [place] to live with her family here.  Patient was brought in because she was complaining of abdominal pain associated with nausea and one day of vomiting. The abdominal pain was described as diffuse and sharp in nature.  Since the patient is recently transferred from [place] to [facility], the family does not have much information about her past history and past medical history.  For the last few days the patient has on and off swelling of her legs and abdomen. Family denies any fever chills cough chest pain shortness of breath diarrhea constipation active bleeding melena.  Family reports patient has been urinating well. Patient has been compliant with her medication. There are no auditory or visual hallucinations.  The family reports that the patient is bed bound for nearly 2 years due to arthritis and is only able to transfer.   Family denies any prior awareness of liver disease, kidney disease, anemia.  Subjective: Awakens more easily- tells me to stop when I palpate her abdomen. Confused otherwise.   Assessment/Plan: Principal Problem:   Abdominal pain - ascitic fluid negative for infection- less tender today  Active Problems:   Acute renal failure--  Metabolic acidosis-- ATN and prerenal? - CVP 5 - granular casts and proteinuria  - FeNa 0.9%-  Prerenal- Hepatorenal? - kidney visualized on CT abdomen and pelvis- no mention of abnormalities - despite hydration, CVP still 5 and poor urine output-  V tach - start IV Lopressor   Acute encephalopathy - interestingly Ammonia levels normal - stopped lactulose    Cirrhosis/ ascites - per daughter, pt did have blood transfusions in the 80s but has no h/o of alcohol or drug abuse - suspect ascites will worsening with hyration- follow closely - ANA and hepatitis panel non-reactive - surprisingly coags and platelets normal.    pleural effusion - unable to diurese- this is due to hypoalbuminemia   "moderately-large" pericardial effusion on CT - Stat ECHO does reveal some tamponade features but BP has improved significantly and pt not a candidate for pericardial window per CT surgery. Hold off on pericardiocentesis for now. Dr Zenaida Niece Tright suspects this effusion is chronic.     Anemia - check anemia profile and stool occults    Acute encephalopathy - likely uremic    Ileus - NG tube- repeat xray does not reveal it to be resolving-enema attempted but due to resistance, RN was not able to give it.  - will obtain CT today with oral contrast    S/P IVC filter    Code Status: full code Family Communication:  With daughter- [name] and Esperanza Heir (daughter and POA) Disposition Plan: follow in SDU- have contacted palliative care after speaking with both daughters  Consultants: PCCM  Procedures: Diagnostic paracentesis Central line  Antibiotics: Antibiotics Given (last 72 hours)   Date/Time Action Medication Dose Rate   04/11/13 0732 Given  cefTRIAXone (ROCEPHIN) 1 g in dextrose 5 % 50 mL IVPB 1 g 100 mL/hr       DVT prophylaxis: Heparin  Objective: Filed Weights   04/11/13 0530 04/12/13 0313 04/13/13 0331  Weight: 61.9 kg (136 lb 7.4 oz) 63.9 kg (140 lb 14 oz) 64.5 kg (142 lb 3.2 oz)   Blood pressure 180/90, pulse 75, temperature 97.8 F (36.6 C),  temperature source Oral, resp. rate 14, height 5\' 2"  (1.575 m), weight 64.5 kg (142 lb 3.2 oz), SpO2 95.00%.  Intake/Output Summary (Last 24 hours) at 04/13/13 1252 Last data filed at 04/13/13 1000  Gross per 24 hour  Intake 2254.67 ml  Output    670 ml  Net 1584.67 ml     Exam: General: No acute respiratory distress- awakens easily, confused Lungs: decreased breath sounds Cardiovascular: Regular rate and rhythm without murmur gallop or rub normal S1 and S2 Abdomen: Nontender, quite distended but soft, bowel sounds negative, no rebound, no ascites, no appreciable mass Extremities: No significant cyanosis, clubbing, or edema bilateral lower extremities  Data Reviewed: Basic Metabolic Panel:  Recent Labs Lab 04/11/13 0156 04/11/13 0205 04/11/13 1600 04/12/13 1415 04/13/13 0445  NA 140 142 141 145 145  K 4.2 4.1 5.0 4.6 4.8  CL 102 108 104 111 109  CO2 18*  --  20 19 20   GLUCOSE 191* 191* 155* 140* 146*  BUN 44* 39* 53* 57* 66*  CREATININE 3.62* 4.20* 4.39* 4.75* 5.38*  CALCIUM 9.5  --  8.5 7.6* 8.3*   Liver Function Tests:  Recent Labs Lab 04/11/13 0156 04/11/13 1600  AST 12 12  ALT 6 7  ALKPHOS 62 46  BILITOT 0.3 <0.2*  PROT 6.7 4.9*  ALBUMIN 2.9* 1.8*    Recent Labs Lab 04/11/13 0156  LIPASE 15    Recent Labs Lab 04/11/13 1815 04/12/13 0405  AMMONIA 27 26   CBC:  Recent Labs Lab 04/11/13 0156 04/11/13 0205 04/12/13 0405  WBC 8.1  --  12.8*  HGB 10.5* 10.5* 10.8*  HCT 30.3* 31.0* 31.2*  MCV 87.3  --  86.9  PLT 348  --  313   Cardiac Enzymes: No results found for this basename: CKTOTAL, CKMB, CKMBINDEX, TROPONINI,  in the last 168 hours BNP (last 3 results)  Recent Labs  04/11/13 0156  PROBNP 4937.0*   CBG:  Recent Labs Lab 04/12/13 0745 04/12/13 1210 04/12/13 1644 04/12/13 2130 04/13/13 0819  GLUCAP 135* 129* 136* 134* 122*    Recent Results (from the past 240 hour(s))  MRSA PCR SCREENING     Status: None   Collection  Time    04/11/13  5:26 AM      Result Value Ref Range Status   MRSA by PCR NEGATIVE  NEGATIVE Final   Comment:            The GeneXpert MRSA Assay (FDA     approved for NASAL specimens     only), is one component of a     comprehensive MRSA colonization     surveillance program. It is not     intended to diagnose MRSA     infection nor to guide or     monitor treatment for     MRSA infections.  BODY FLUID CULTURE     Status: None   Collection Time    04/11/13 10:37 AM      Result Value Ref Range Status   Specimen Description ASCITIC   Final   Special Requests  NONE   Final   Gram Stain     Final   Value: NO WBC SEEN     NO ORGANISMS SEEN     Performed at [facility]   Culture     Final   Value: NO GROWTH 2 DAYS     [facility]formed at [facility]   Report Status PENDING   Incomplete  CULTURE, BLOOD (ROUTINE X 2)     Status: None   Collection Time    04/11/13 12:30 PM      Result Value Ref Range Status   Specimen Description BLOOD CENTRAL LINE RIGHT JJ   Final   Special Requests BOTTLES DRAWN AEROBIC AND ANAEROBIC 5CC   Final   Culture  Setup Time     Final   Value: 04/11/2013 18:16     Performed at [facility]   Culture     Final   Value:        BLOOD CULTURE RECEIVED NO GROWTH TO DATE CULTURE WILL BE HELD FOR 5 DAYS BEFORE ISSUING A FINAL NEGATIVE REPORT     [facility]formed at [facility]   Report Status PENDING   Incomplete     Studies:  Recent x-ray studies have been reviewed in detail by the Attending Physician  Scheduled Meds:  Scheduled Meds: . antiseptic oral rinse  15 mL Mouth Rinse q12n4p  . chlorhexidine  15 mL Mouth Rinse BID  . furosemide  80 mg Intravenous BID  . heparin  5,000 Units Subcutaneous 3 times per day  . insulin aspart  0-9 Units Subcutaneous 6 times per day  . iohexol  25 mL Oral Q1 Hr x 2  . metoCLOPramide (REGLAN) injection  5 mg Intravenous Q12H  . metoprolol  5 mg Intravenous 4 times per day  . pantoprazole  (PROTONIX) IV  40 mg Intravenous Q24H  . sodium chloride  3 mL Intravenous Q12H   Continuous Infusions: . sodium chloride Stopped (04/12/13 1129)  . sodium chloride 100 mL/hr at 04/13/13 0800    Time spent on care of this patient: >35 min   Calvert CantorIZWAN,Nickolai Rinks, MD  [facility] Office  [phone] Pager - Text Page per Loretha StaplerAmion as per below:  On-Call/Text Page:      [email or web address]  If 7PM-7AM, please contact night-coverage [email or web address] 04/13/2013, 12:52 PM   LOS: 3 days

## 2013-04-13 NOTE — Progress Notes (Signed)
1 of 2 administrations of omnipaque given thus far; pt vomited decent amount of yellow and clear fluid; PRN zofran given; will attempt to give 2nd administration now, at slower intervals through NG; will continue to monitor closely and update as needed

## 2013-04-13 NOTE — Progress Notes (Signed)
MD paged about pt's short bursts of SVT; awaiting page back; PRN labetlol given at [phone]; will continue to monitor closely and update as needed

## 2013-04-14 DIAGNOSIS — I4891 Unspecified atrial fibrillation: Secondary | ICD-10-CM | POA: Diagnosis not present

## 2013-04-14 LAB — BASIC METABOLIC PANEL
BUN: 68 mg/dL — ABNORMAL HIGH (ref 6–23)
CO2: 17 meq/L — AB (ref 19–32)
CREATININE: 5.75 mg/dL — AB (ref 0.50–1.10)
Calcium: 8 mg/dL — ABNORMAL LOW (ref 8.4–10.5)
Chloride: 110 mEq/L (ref 96–112)
GFR calc Af Amer: 7 mL/min — ABNORMAL LOW (ref >90)
GFR calc non Af Amer: 6 mL/min — ABNORMAL LOW (ref >90)
Glucose, Bld: 114 mg/dL — ABNORMAL HIGH (ref 70–99)
Potassium: 4.2 mEq/L (ref 3.7–5.3)
Sodium: 146 mEq/L (ref 137–147)

## 2013-04-14 LAB — GLUCOSE, CAPILLARY
GLUCOSE-CAPILLARY: 103 mg/dL — AB (ref 70–99)
GLUCOSE-CAPILLARY: 109 mg/dL — AB (ref 70–99)
Glucose-Capillary: 118 mg/dL — ABNORMAL HIGH (ref 70–99)

## 2013-04-14 MED ORDER — SODIUM CHLORIDE 0.9 % IV SOLN
INTRAVENOUS | Status: DC
  Administered 2013-04-14: 10 mL/h via INTRAVENOUS
  Administered 2013-04-15: 14:00:00 via INTRAVENOUS
  Administered 2013-04-16: 20 mL/h via INTRAVENOUS

## 2013-04-14 MED ORDER — DILTIAZEM HCL 100 MG IV SOLR
5.0000 mg/h | INTRAVENOUS | Status: DC
  Administered 2013-04-14: 5 mg/h via INTRAVENOUS
  Filled 2013-04-14: qty 100

## 2013-04-14 MED ORDER — MORPHINE SULFATE 10 MG/ML IJ SOLN
0.5000 mg/h | INTRAMUSCULAR | Status: DC
  Administered 2013-04-14: 0.5 mg/h via INTRAVENOUS
  Filled 2013-04-14: qty 10

## 2013-04-14 NOTE — Progress Notes (Signed)
[facility] Progress Note [place] TEAM 1 - Stepdown/ICU TEAM   [place] ZOX:[id number] DOB: November 29, 1923 DOA: 04/10/2013 PCP: No primary provider on file.  Brief narrative: Anna Preston is a 80 y.o. female presenting on 04/10/2013 with  has a past medical history of CHF (congestive heart failure) and Hypertension who presents with the patient was brought in by family. The patient is a resident at [place], she was recently admitted for pneumonia at [facility], after that she was sent to a rehabilitation facility. Since patient's daughter there was unable to provide care for the patient the patient was moved to [place] to live with her family here.  Patient was brought in because she was complaining of abdominal pain associated with nausea and one day of vomiting. The abdominal pain was described as diffuse and sharp in nature.  Since the patient is recently transferred from [place] to [place], the family does not have much information about her past history and past medical history.  Patient presented with an elevated pending at 3.62 this continues to decline since admission. On 3/16, white blood cell count started to increase. Date 3/18, patient went into rapid ventricular rate atrial fibrillation. She remains minimally responsive and n.p.o. After extensive discussion with patient's daughter who lives locally as well as daughter in [place] who is power of attorney, all parties involved agreed that patient should be made comfortable. She was made comfort care and medications except for pain and the ease of breathing were discontinued.  Subjective: Awakens more easily- tells me to stop when I palpate her abdomen. Confused otherwise.   Assessment/Plan: Principal Problem:   Abdominal pain  Atrial fibrillation with rapid ventricular rate: Problem. Has stopped Cardizem that the patient's comfort care  - ascitic fluid negative for infection- less tender  today  Active Problems:   Acute renal failure--  Metabolic acidosis-- ATN and prerenal? - CVP 5 - granular casts and proteinuria  - FeNa 0.9%- Prerenal- Hepatorenal? - kidney visualized on CT abdomen and pelvis- no mention of abnormalities - despite hydration, CVP still 5 and poor urine output- Renal function worse  V tach - start IV Lopressor   Acute encephalopathy - interestingly Ammonia levels normal - stopped lactulose Persistent    Cirrhosis/ ascites - per daughter, pt did have blood transfusions in the 80s but has no h/o of alcohol or drug abuse - suspect ascites will worsening with hyration- follow closely - ANA and hepatitis panel non-reactive - surprisingly coags and platelets normal.    pleural effusion - unable to diurese- this is due to hypoalbuminemia   "moderately-large" pericardial effusion on CT - Stat ECHO does reveal some tamponade features but BP has improved significantly and pt not a candidate for pericardial window per CT surgery. Hold off on pericardiocentesis for now. Dr Zenaida Niece Tright suspects this effusion is chronic.     Anemia - check anemia profile and stool occults    Acute encephalopathy - likely uremic    Ileus - NG tube- repeat xray does not reveal it to be resolving-enema attempted but due to resistance, RN was not able to give it. Discontinue for comfort care    S/P IVC filter    Code Status: Comfort care/DO NOT RESUSCITATE Family Communication:  With daughter- [name] and Esperanza Heir (daughter and POA) Disposition Plan: Comfort care, transfer to floor  Consultants: PCCM Palliative care  Procedures: Diagnostic paracentesis Central line  Antibiotics: Antibiotics Given (last 72 hours)   None  DVT prophylaxis: Heparin  Objective: Filed Weights   04/12/13 0313 04/13/13 0331 04/14/13 0400  Weight: 63.9 kg (140 lb 14 oz) 64.5 kg (142 lb 3.2 oz) 66.3 kg (146 lb 2.6 oz)   Blood pressure 148/58, pulse 76,  temperature 97.7 F (36.5 C), temperature source Oral, resp. rate 20, height 5\' 2"  (1.575 m), weight 66.3 kg (146 lb 2.6 oz), SpO2 97.00%.  Intake/Output Summary (Last 24 hours) at 04/14/13 1414 Last data filed at 04/14/13 1300  Gross per 24 hour  Intake 2387.7 ml  Output   1305 ml  Net 1082.7 ml     Exam: General: No acute respiratory distress- minimally responsive Lungs: decreased breath sounds Cardiovascular: Irregular rhythm, tachycardic Abdomen: Nontender?, quite distended firm, hypoactive bowel sounds Extremities: No significant cyanosis, clubbing, or edema bilateral lower extremities  Data Reviewed: Basic Metabolic Panel:  Recent Labs Lab 04/11/13 0156 04/11/13 0205 04/11/13 1600 04/12/13 1415 04/13/13 0445 04/14/13 0530  NA 140 142 141 145 145 146  K 4.2 4.1 5.0 4.6 4.8 4.2  CL 102 108 104 111 109 110  CO2 18*  --  20 19 20  17*  GLUCOSE 191* 191* 155* 140* 146* 114*  BUN 44* 39* 53* 57* 66* 68*  CREATININE 3.62* 4.20* 4.39* 4.75* 5.38* 5.75*  CALCIUM 9.5  --  8.5 7.6* 8.3* 8.0*   Liver Function Tests:  Recent Labs Lab 04/11/13 0156 04/11/13 1600  AST 12 12  ALT 6 7  ALKPHOS 62 46  BILITOT 0.3 <0.2*  PROT 6.7 4.9*  ALBUMIN 2.9* 1.8*    Recent Labs Lab 04/11/13 0156  LIPASE 15    Recent Labs Lab 04/11/13 1815 04/12/13 0405  AMMONIA 27 26   CBC:  Recent Labs Lab 04/11/13 0156 04/11/13 0205 04/12/13 0405  WBC 8.1  --  12.8*  HGB 10.5* 10.5* 10.8*  HCT 30.3* 31.0* 31.2*  MCV 87.3  --  86.9  PLT 348  --  313   Cardiac Enzymes: No results found for this basename: CKTOTAL, CKMB, CKMBINDEX, TROPONINI,  in the last 168 hours BNP (last 3 results)  Recent Labs  04/11/13 0156  PROBNP 4937.0*   CBG:  Recent Labs Lab 04/13/13 1740 04/13/13 2035 04/14/13 0042 04/14/13 0431 04/14/13 0849  GLUCAP 97 116* 103* 109* 118*    Recent Results (from the past 240 hour(s))  MRSA PCR SCREENING     Status: None   Collection Time     04/11/13  5:26 AM      Result Value Ref Range Status   MRSA by PCR NEGATIVE  NEGATIVE Final   Comment:            The GeneXpert MRSA Assay (FDA     approved for NASAL specimens     only), is one component of a     comprehensive MRSA colonization     surveillance program. It is not     intended to diagnose MRSA     infection nor to guide or     monitor treatment for     MRSA infections.  BODY FLUID CULTURE     Status: None   Collection Time    04/11/13 10:37 AM      Result Value Ref Range Status   Specimen Description ASCITIC   Final   Special Requests NONE   Final   Gram Stain     Final   Value: NO WBC SEEN     NO ORGANISMS SEEN     Performed  at [facility]     Final   Value: NO GROWTH 3 DAYS     Performed at [facility]   Report Status PENDING   Incomplete  CULTURE, BLOOD (ROUTINE X 2)     Status: None   Collection Time    04/11/13 12:30 PM      Result Value Ref Range Status   Specimen Description BLOOD CENTRAL LINE RIGHT JJ   Final   Special Requests BOTTLES DRAWN AEROBIC AND ANAEROBIC 5CC   Final   Culture  Setup Time     Final   Value: 04/11/2013 18:16     Performed at [facility]   Culture     Final   Value:        BLOOD CULTURE RECEIVED NO GROWTH TO DATE CULTURE WILL BE HELD FOR 5 DAYS BEFORE ISSUING A FINAL NEGATIVE REPORT     Performed at [facility]   Report Status PENDING   Incomplete     Studies:  Recent x-ray studies have been reviewed in detail by the Attending Physician  Scheduled Meds:  Scheduled Meds:   Continuous Infusions: . sodium chloride 10 mL/hr (04/14/13 1120)  . morphine 0.5 mg/hr (04/14/13 1117)    Time spent on care of this patient: 25 min   Hollice Espy, MD  [facility] Office  [phone] Pager - Text Page per Amion as per below:  On-Call/Text Page:      [email or web address]  If 7PM-7AM, please contact night-coverage [email or web address] 04/14/2013, 2:14 PM   LOS: 4 days

## 2013-04-14 NOTE — Progress Notes (Signed)
Pt heart rate started to increase into the 130s-150s starting around 7am; pt asymptomatic;states she feels OK. BP 116/57; will notify MD; will continue to monitor closely and update as needed;

## 2013-04-14 NOTE — Progress Notes (Signed)
Palliative Medicine consult received. Call placed to primary contact-was unable to reach them to schedule meeting for goals of care. We will schedule with family at earliest possible time we have a provider available and are able to connect with patient's daughter [name] [phone]).  Anderson MaltaElizabeth Golding, DO Palliative Medicine

## 2013-04-14 NOTE — Progress Notes (Signed)
Pt transferred to 6E. I e-mailed a handoff for pt's new unit CSW, explaining family had been looking into ALF placement for pt and I left a list of ALFs on the chart with my contact information inviting the family to call if they have any questions or needs. I was never contacted by pt's family, so I recommended pt's new CSW check in with family if possible to see if they need any additional support. I am signing off.   Maryclare LabradorJulie Markon Jares, MSW, [facility]CSWA Clinical Social Worker [phone]

## 2013-04-14 NOTE — Progress Notes (Signed)
[facility]      DOB: 1923/08/15      AVW:[id number]  Contacted by Dr. Rito EhrlichKrishnan.  GOC established by him with POA.  Consult has been discontinued.  Will sign off.   Chianti Goh L. Ladona Ridgelaylor, MD MBA The Palliative Medicine Team at [facility] Team Phone: [phone] Pager: [phone]

## 2013-04-15 LAB — BODY FLUID CULTURE
Culture: NO GROWTH
Gram Stain: NONE SEEN

## 2013-04-15 NOTE — Clinical Social Work Note (Signed)
CSW intern was updated by the MD that POA agreeable with residential hospice for the patient. CSW intern contacted POA, Esperanza HeirPennie Collins and talked with her about residential hospice placement. Ms. Thomasena EdisCollins is agreeable with residential hospice and [facility] and [facility]. Mrs. Collins also advised CSW intern that the decision to which facility can be made with her sister Eber JonesCarolyn. CSW intern then, contacted Forrestine HimEva Tobie Hellen [facility]. Carley Hammedva advised CSW intern that there were no beds available today and she is certain that there will be none tomorrow, 04/16/2013. Carley Hammedva will contact CSW in the morning if anything opens up. CSW intern contacted Lafonda MossesDiana with [facility]. Lafonda MossesDiana advised CSW intern that beds were available and would start the process. CSW intern recontacted POA to update her on hospice bed availability. CSW will follow up with Ms. Collins on bed availability.   SW, Johanna and Lafonda MossesDiana [facility] spoke with family today about their hospice facility. Lafonda MossesDiana and Loistine SimasJohanna updated CSW and CSW intern about the conversation between them and the family. CSW and CSW intern introduce self to family and discussed how comfort care works in terms of patient remaining in the hospital and the need for a decision on hospice placement by tomorrow morning. Family understands and will be in contact with CSW in the morning. Family has CSW's contact information for any additional needs.    Deniece ReeBrianna Ronith Berti, CSW Intern.

## 2013-04-15 NOTE — Clinical Social Work Note (Deleted)
CSW intern was updated by the MD that POA would go with residental hospice for the patient. CSW intern contacted POA, Esperanza HeirPennie Collins and informed her on information about residential hospice. Ms[place] Thomasena [place]disCollins [place] and would be okay with [facility] as well if no [place] were available [place] . CSW intern then, contacted Forrestine HimEva Davis [facility]. Carley Hammedva advised CSW intern that there were no beds available and certain there would be none tomorrow 04/16/2013. Carley Hammedva would contact CSW in the morning if anything opened up. CSW intern then, contacted Lafonda MossesDiana with [facility]. Lafonda MossesDiana advised CSW intern that beds were available and would start the process. CSW intern advised POA that [place] but [place] [facility]. CSW will follow up with Ms. Collins on any more information given.     Deniece ReeBrianna Davis, CSW Intern.

## 2013-04-15 NOTE — Clinical Social Work Note (Signed)
Reviewed and approved as written.  Genelle BalVanessa Tameron Lama, LCSW

## 2013-04-15 NOTE — Clinical Social Work Note (Cosign Needed)
CSW intern was updated by the MD that POA would go with residental hospice for the patient. CSW intern contacted POA, Esperanza HeirPennie Collins and informed her on information about residential hospice. Ms. Thomasena EdisCollins [place] and would be okay with [facility] as well if no beds were available [place] . CSW intern then, contacted Forrestine HimEva Taydem Cavagnaro [facility]. Carley Hammedva advised CSW intern that there were no beds available and certain there would be none tomorrow 04/16/2013. Carley Hammedva would contact CSW in the morning if anything opened up. CSW intern then, contacted Lafonda MossesDiana with [facility]. Lafonda MossesDiana advised CSW intern that beds were available and would start the process. CSW intern advised POA that [place] [facility]. CSW will follow up with Ms. Collins on any more information given.  Loistine SimasJohanna and Lafonda MossesDiana, [facility] spoke with family and patient. Lafonda MossesDiana updated CSW and CSW intern about hospice meeting and the family wants to talk it over. CSW and CSW intern spoke with patient's daughter [name] about comfort care and how the family will need a decision early tomorrow morning. CSW intern explained hospices likes the patients early to get them comfortable. Eber JonesCarolyn has CSW contact information for any additional questions or information needed.    Deniece ReeBrianna Lake Cinquemani CSW Intern.

## 2013-04-15 NOTE — Progress Notes (Signed)
[facility] PROGRESS NOTE  Jazmaine Fuelling AOZ:[id number] DOB: 02-18-1923 DOA: 04/10/2013 PCP: No primary provider on file.  Assessment/Plan: Atrial fibrillation with rapid ventricular rate: Problem. Has stopped Cardizem that the patient's comfort care  - ascitic fluid negative for infection- less tender today  Active Problems:  Acute renal failure-- Metabolic acidosis-- ATN and prerenal?  - CVP 5  - granular casts and proteinuria  - FeNa 0.9%- Prerenal- Hepatorenal?  - kidney visualized on CT abdomen and pelvis- no mention of abnormalities  - Continue comfort care  V tach  - Continue IV Lopressor   Acute encephalopathy  - interestingly Ammonia levels normal - stopped lactulose  Persistent   Cirrhosis/ ascites  - per daughter, pt did have blood transfusions in the 80s but has no h/o of alcohol or drug abuse  - suspect ascites will worsening with hyration- follow closely  - ANA and hepatitis panel non-reactive  - surprisingly coags and platelets normal.   pleural effusion  - unable to diurese- this is due to hypoalbuminemia  "moderately-large" pericardial effusion on CT  - Stat ECHO does reveal some tamponade features but BP has improved significantly and pt not a candidate for pericardial window per CT surgery. Hold off on pericardiocentesis for now. Dr Zenaida Niece Tright suspects this effusion is chronic.   Anemia  - check anemia profile and stool occults   Acute encephalopathy  - likely uremic   Ileus  - Continue comfort care  S/P IVC filter    Code Status: Comfort care/DO NOT RESUSCITATE  Family Communication: With daughter: Esperanza Heir (daughter and POA)  Disposition Plan: Comfort care, discussed transitioning to residential hospice with Mrs Thomasena Edis (poa)   Consultants:  Critical care: transition of care to our team  Palliative care: signed off  Procedures:  Please review EMR  Antibiotics:  None  HPI/Subjective: Patient has no new complaints. Limited  responses, still confused  Objective: Filed Vitals:   04/15/13 0504  BP: 150/69  Pulse: 103  Temp: 98.6 F (37 C)  Resp: 20    Intake/Output Summary (Last 24 hours) at 04/15/13 1231 Last data filed at 04/15/13 9629  Gross per 24 hour  Intake 366.76 ml  Output      1 ml  Net 365.76 ml   Filed Weights   04/13/13 0331 04/14/13 0400 04/14/13 2038  Weight: 64.5 kg (142 lb 3.2 oz) 66.3 kg (146 lb 2.6 oz) 65.6 kg (144 lb 10 oz)    Exam:   General:  Pt in NAD, resting comfortably  Cardiovascular: s1 and s2, no rubs  Respiratory: CTA BL anteriorly, no wheezes  Abdomen: soft, ND  Musculoskeletal: no cyanosis or clubbing   Data Reviewed: Basic Metabolic Panel:  Recent Labs Lab 04/11/13 0156 04/11/13 0205 04/11/13 1600 04/12/13 1415 04/13/13 0445 04/14/13 0530  NA 140 142 141 145 145 146  K 4.2 4.1 5.0 4.6 4.8 4.2  CL 102 108 104 111 109 110  CO2 18*  --  20 19 20  17*  GLUCOSE 191* 191* 155* 140* 146* 114*  BUN 44* 39* 53* 57* 66* 68*  CREATININE 3.62* 4.20* 4.39* 4.75* 5.38* 5.75*  CALCIUM 9.5  --  8.5 7.6* 8.3* 8.0*   Liver Function Tests:  Recent Labs Lab 04/11/13 0156 04/11/13 1600  AST 12 12  ALT 6 7  ALKPHOS 62 46  BILITOT 0.3 <0.2*  PROT 6.7 4.9*  ALBUMIN 2.9* 1.8*    Recent Labs Lab 04/11/13 0156  LIPASE 15    Recent Labs  Lab 04/11/13 1815 04/12/13 0405  AMMONIA 27 26   CBC:  Recent Labs Lab 04/11/13 0156 04/11/13 0205 04/12/13 0405  WBC 8.1  --  12.8*  HGB 10.5* 10.5* 10.8*  HCT 30.3* 31.0* 31.2*  MCV 87.3  --  86.9  PLT 348  --  313   Cardiac Enzymes: No results found for this basename: CKTOTAL, CKMB, CKMBINDEX, TROPONINI,  in the last 168 hours BNP (last 3 results)  Recent Labs  04/11/13 0156  PROBNP 4937.0*   CBG:  Recent Labs Lab 04/13/13 1740 04/13/13 2035 04/14/13 0042 04/14/13 0431 04/14/13 0849  GLUCAP 97 116* 103* 109* 118*    Recent Results (from the past 240 hour(s))  MRSA PCR SCREENING      Status: None   Collection Time    04/11/13  5:26 AM      Result Value Ref Range Status   MRSA by PCR NEGATIVE  NEGATIVE Final   Comment:            The GeneXpert MRSA Assay (FDA     approved for NASAL specimens     only), is one component of a     comprehensive MRSA colonization     surveillance program. It is not     intended to diagnose MRSA     infection nor to guide or     monitor treatment for     MRSA infections.  BODY FLUID CULTURE     Status: None   Collection Time    04/11/13 10:37 AM      Result Value Ref Range Status   Specimen Description ASCITIC   Final   Special Requests NONE   Final   Gram Stain     Final   Value: NO WBC SEEN     NO ORGANISMS SEEN     Performed at [facility]   Culture     Final   Value: NO GROWTH 3 DAYS     Performed at [facility]   Report Status 04/15/2013 FINAL   Final  CULTURE, BLOOD (ROUTINE X 2)     Status: None   Collection Time    04/11/13 12:30 PM      Result Value Ref Range Status   Specimen Description BLOOD CENTRAL LINE RIGHT JJ   Final   Special Requests BOTTLES DRAWN AEROBIC AND ANAEROBIC 5CC   Final   Culture  Setup Time     Final   Value: 04/11/2013 18:16     Performed at [facility]   Culture     Final   Value:        BLOOD CULTURE RECEIVED NO GROWTH TO DATE CULTURE WILL BE HELD FOR 5 DAYS BEFORE ISSUING A FINAL NEGATIVE REPORT     Performed at [facility]   Report Status PENDING   Incomplete     Studies: No results found.  Scheduled Meds:  Continuous Infusions: . sodium chloride 20 mL/hr at 04/14/13 1400  . morphine 0.5 mg/hr (04/14/13 1117)    Principal Problem:   Acute encephalopathy Active Problems:   Acute renal failure   Cirrhosis   Anemia   Metabolic acidosis   Hyperglycemia   Abdominal pain, unspecified site   Ileus   S/P IVC filter   ARF (acute renal failure)   Hypotension   Atrial fibrillation    Time spent: > 40 minutes    Penny Pia  [facility] Pager [phone] If 7PM-7AM, please contact night-coverage  at [email or web address], password [email or web address] 04/15/2013, 12:31 PM  LOS: 5 days

## 2013-04-16 MED ORDER — MORPHINE SULFATE (CONCENTRATE) 10 MG /0.5 ML PO SOLN: 2.0000 mg | ORAL | Status: AC | PRN

## 2013-04-16 NOTE — Progress Notes (Signed)
Nutrition Brief Note  Chart reviewed. Pt now transitioning to comfort care.  No further nutrition interventions warranted at this time.  Please re-consult as needed.   Aedin Jeansonne MS, RD, LDN Inpatient Registered Dietitian Pager: [phone] After-hours pager: [phone]    

## 2013-04-16 NOTE — Progress Notes (Signed)
04/15/13 20:30 Patient's daughter said she spoke with her other sister and they both decided to bring patient home with maybe home hospice since [facility] facility is a little bit far from where they live. Shantasia Hunnell Joselita,RN

## 2013-04-16 NOTE — Clinical Social Work Note (Signed)
[facility]. [facility] completed by Nelson Chimesiana Stevens with daughter Ms. Hairston at the bedside. CSW facilitated transport to hospice facility via ambulance.   Genelle BalVanessa Osmar Howton, MSW, LCSW [phone]

## 2013-04-16 NOTE — Progress Notes (Signed)
Pt [facility]. Peripheral IV left in place, to be used by Hospice. Skin intact except as most recently charted. Vitals are stable. Report called to Vernona RiegerLaura at receiving facility. Pt to be transported by ambulance service.  Peri MarisAndrew Charlis Harner, MBA, BS, RN

## 2013-04-16 NOTE — Discharge Summary (Signed)
Physician Discharge Summary  Anna Preston ONG:[id number] DOB: 1923/06/24 DOA: 04/10/2013  PCP: No primary provider on file.  Admit date: 04/10/2013 Discharge date: 04/16/2013  Time spent: > 35 minutes  Recommendations for Outpatient Follow-up:  1. Please continue to ensure comfort care measures  Discharge Diagnoses:  Principal Problem:   Acute encephalopathy Active Problems:   Acute renal failure   Cirrhosis   Anemia   Metabolic acidosis   Hyperglycemia   Abdominal pain, unspecified site   Ileus   S/P IVC filter   ARF (acute renal failure)   Hypotension   Atrial fibrillation   Discharge Condition: Stable for transfer to residential hospice  Diet recommendation: Low sodium heart healthy/comfort feeds  Filed Weights   04/13/13 0331 04/14/13 0400 04/14/13 2038  Weight: 64.5 kg (142 lb 3.2 oz) 66.3 kg (146 lb 2.6 oz) 65.6 kg (144 lb 10 oz)    History of present illness: /Hospital Course:  Anna Preston is a 80 y.o. female presenting on 04/10/2013 with has a past medical history of CHF (congestive heart failure) and Hypertension who presents with the patient was brought in by family. The patient is a resident at [place], she was recently admitted for pneumonia at [facility], after that she was sent to a rehabilitation facility. Since patient's daughter there was unable to provide care for the patient the patient was moved to [place] to live with her family here.  Patient was brought in because she was complaining of abdominal pain associated with nausea and one day of vomiting. The abdominal pain was described as diffuse and sharp in nature. Since the patient is recently transferred from [place] to hear, the family does not have much information about her past history and past medical history.  Patient presented with an elevated pending at 3.62 this continues to decline since admission. On 3/16, white blood cell count started to increase. Date 3/18, patient  went into rapid ventricular rate atrial fibrillation. She remains minimally responsive and n.p.o. After extensive discussion with patient's daughter who lives locally as well as daughter in [place] who is power of attorney, all parties involved agreed that patient should be made comfortable. She was made comfort care and medications except for pain and the ease of breathing were discontinued.   Consultations:  Palliative care  Discharge Exam: Filed Vitals:   04/16/13 0833  BP: 154/67  Pulse: 100  Temp: 98.1 F (36.7 C)  Resp: 20    General: Pt in NAD, Alert and awake Cardiovascular: irregularly irregular, no rubs Respiratory: CTA BL, no wheezes  Discharge Instructions  Discharge Orders   Future Orders Complete By Expires   Call MD for:  severe uncontrolled pain  As directed    Call MD for:  temperature >100.4  As directed    Diet - low sodium heart healthy  As directed    Discharge instructions  As directed    Comments:     Continue to ensure comfort care measures. Adjust pain medication as needed to keep patient comfortable.   Increase activity slowly  As directed        Medication List    STOP taking these medications       amLODipine 10 MG tablet  Commonly known as:  NORVASC     carvedilol 3.125 MG tablet  Commonly known as:  COREG     clopidogrel 75 MG tablet  Commonly known as:  PLAVIX     docusate sodium 100 MG capsule  Commonly known as:  COLACE     folic acid 1 MG tablet  Commonly known as:  FOLVITE     isosorbide dinitrate 20 MG tablet  Commonly known as:  ISORDIL     pantoprazole 40 MG tablet  Commonly known as:  PROTONIX     senna 8.6 MG Tabs tablet  Commonly known as:  SENOKOT     vitamin B-12 1000 MCG tablet  Commonly known as:  CYANOCOBALAMIN      TAKE these medications       morphine CONCENTRATE 10 mg / 0.5 ml concentrated solution  Take 0.1 mLs (2 mg total) by mouth every 4 (four) hours as needed for severe pain.       No  Known Allergies    The results of significant diagnostics from this hospitalization (including imaging, microbiology, ancillary and laboratory) are listed below for reference.    Significant Diagnostic Studies: Ct Abdomen Pelvis Wo Contrast  04/11/2013   CLINICAL DATA:  Abdominal pain  EXAM: CT ABDOMEN AND PELVIS WITHOUT CONTRAST  TECHNIQUE: Multidetector CT imaging of the abdomen and pelvis was performed following the standard protocol without intravenous contrast.  COMPARISON:  None.  FINDINGS: Bilateral pleural effusions and bibasilar atelectasis left greater than right. Moderately large pericardial effusion. Small hiatal hernia.  Small irregular liver consistent with cirrhosis. Large amount of ascites consistent with liver failure. There is diffuse anasarca.  Small bowel is moderately dilated suggestive of small bowel obstruction. The colon is decompressed. Negative for adenopathy. IVC filter noted extending into the hepatic IVC.  IMPRESSION: Bilateral pleural effusions. Pericardial effusion. Large amount of ascites.  Cirrhosis of the liver.  Small bowel obstruction.   Electronically Signed   By: Marlan Palau M.D.   On: 04/11/2013 03:02   Ct Head Wo Contrast  04/11/2013   CLINICAL DATA:  Altered mental status  EXAM: CT HEAD WITHOUT CONTRAST  TECHNIQUE: Contiguous axial images were obtained from the base of the skull through the vertex without intravenous contrast.  COMPARISON:  None.  FINDINGS: Mild atrophy. Negative for hydrocephalus. Mild chronic microvascular ischemic change in the white matter.  Negative for acute infarct.  Negative for hemorrhage or mass.  NG tube in place. Paranasal sinuses are clear. No acute skull abnormality.  IMPRESSION: No acute abnormality.   Electronically Signed   By: Marlan Palau M.D.   On: 04/11/2013 06:46   US Paracentesis  04/11/2013   CLINICAL DATA:  Abdominal ascites  EXAM: ULTRASOUND GUIDED left lower quadrant PARACENTESIS; PORTABLE  COMPARISON:  None.   PROCEDURE: An ultrasound guided paracentesis was thoroughly discussed with the patient and questions answered. The benefits, risks, alternatives and complications were also discussed. The patient understands and wishes to proceed with the procedure. Written consent was obtained.  Ultrasound was performed to localize and mark an adequate pocket of fluid in the left lower quadrant of the abdomen. The area was then prepped and draped in the normal sterile fashion. 1% Lidocaine was used for local anesthesia. Under ultrasound guidance a 19 gauge Yueh catheter was introduced. Paracentesis was performed. The catheter was removed and a dressing applied.  Complications: None.  FINDINGS: A total of approximately 180 cc of blood-tinged fluid was removed. A fluid sample was sent for laboratory analysis.  IMPRESSION: Successful ultrasound guided portable paracentesis yielding 180 cc of ascites.  Read by: Beckey Downing [facility]   Electronically Signed   By: Malachy Moan M.D.   On: 04/11/2013 11:04   Dg Chest [facility]  04/11/2013   CLINICAL DATA:  Right IJ central line placement  EXAM: PORTABLE CHEST - 1 VIEW  COMPARISON:  None.  FINDINGS: A right IJ central venous catheter is present. The tip is in good position at the superior cavoatrial junction. There is dense opacification of the left lung base as well as right-to-left shift of the cardiac and mediastinal structures. The degree of shift is likely slightly exaggerated by leftward rotation of the patient. Layering left pleural effusion. The right lung is clear. No pneumothorax. A nasogastric tube is present. The tip lies below the field of view presumably within the stomach. An IVC filter is identified in the right upper quadrant. Suspect super of renal location. Enlarged cardiopericardial silhouette.  IMPRESSION: 1. Left pleural effusion and associated left basilar atelectasis. The resultant volume loss results in right to left shift of the cardiac and mediastinal  structures. 2. The tip of the right IJ central venous catheter projects over the superior cavoatrial junction. 3. The tip of the nasogastric tube lies off the field of view and is presumably within the stomach. 4. Probable suprarenal IVC filter.   Electronically Signed   By: Malachy Moan M.D.   On: 04/11/2013 11:06   Dg Abd Portable 1v  04/12/2013   CLINICAL DATA:  Abdominal pain  EXAM: PORTABLE ABDOMEN - 1 VIEW  COMPARISON:  CT ABD/PELV WO CM dated 04/11/2013  FINDINGS: There are loops of moderately distended gas-filled small bowel in the left lower quadrant of the abdomen. There is normal caliber colon demonstrated on the right. There is gas-filled viscus over the left mid to lower abdomen which overlies the proximal port and tip of the esophagogastric tube. This may reflect an inferiorly located stomach bubble. There is abnormal density at the left lung base.  IMPRESSION: The bowel gas pattern is nonspecific but certainly may reflect a partial distal small-bowel obstruction or ileus. The patient has an known large volume of ascites.   Electronically Signed   By: David  [place]   On: 04/12/2013 13:29    Microbiology: Recent Results (from the past 240 hour(s))  MRSA PCR SCREENING     Status: None   Collection Time    04/11/13  5:26 AM      Result Value Ref Range Status   MRSA by PCR NEGATIVE  NEGATIVE Final   Comment:            The GeneXpert MRSA Assay (FDA     approved for NASAL specimens     only), is one component of a     comprehensive MRSA colonization     surveillance program. It is not     intended to diagnose MRSA     infection nor to guide or     monitor treatment for     MRSA infections.  BODY FLUID CULTURE     Status: None   Collection Time    04/11/13 10:37 AM      Result Value Ref Range Status   Specimen Description ASCITIC   Final   Special Requests NONE   Final   Gram Stain     Final   Value: NO WBC SEEN     NO ORGANISMS SEEN     Performed at [facility]    Culture     Final   Value: NO GROWTH 3 DAYS     Performed at [facility]   Report Status 04/15/2013 FINAL   Final  CULTURE, BLOOD (ROUTINE X 2)  Status: None   Collection Time    04/11/13 12:30 PM      Result Value Ref Range Status   Specimen Description BLOOD CENTRAL LINE RIGHT JJ   Final   Special Requests BOTTLES DRAWN AEROBIC AND ANAEROBIC 5CC   Final   Culture  Setup Time     Final   Value: 04/11/2013 18:16     Performed at [facility]   Culture     Final   Value:        BLOOD CULTURE RECEIVED NO GROWTH TO DATE CULTURE WILL BE HELD FOR 5 DAYS BEFORE ISSUING A FINAL NEGATIVE REPORT     Performed at [facility]   Report Status PENDING   Incomplete     Labs: Basic Metabolic Panel:  Recent Labs Lab 04/11/13 0156 04/11/13 0205 04/11/13 1600 04/12/13 1415 04/13/13 0445 04/14/13 0530  NA 140 142 141 145 145 146  K 4.2 4.1 5.0 4.6 4.8 4.2  CL 102 108 104 111 109 110  CO2 18*  --  20 19 20  17*  GLUCOSE 191* 191* 155* 140* 146* 114*  BUN 44* 39* 53* 57* 66* 68*  CREATININE 3.62* 4.20* 4.39* 4.75* 5.38* 5.75*  CALCIUM 9.5  --  8.5 7.6* 8.3* 8.0*   Liver Function Tests:  Recent Labs Lab 04/11/13 0156 04/11/13 1600  AST 12 12  ALT 6 7  ALKPHOS 62 46  BILITOT 0.3 <0.2*  PROT 6.7 4.9*  ALBUMIN 2.9* 1.8*    Recent Labs Lab 04/11/13 0156  LIPASE 15    Recent Labs Lab 04/11/13 1815 04/12/13 0405  AMMONIA 27 26   CBC:  Recent Labs Lab 04/11/13 0156 04/11/13 0205 04/12/13 0405  WBC 8.1  --  12.8*  HGB 10.5* 10.5* 10.8*  HCT 30.3* 31.0* 31.2*  MCV 87.3  --  86.9  PLT 348  --  313   Cardiac Enzymes: No results found for this basename: CKTOTAL, CKMB, CKMBINDEX, TROPONINI,  in the last 168 hours BNP: BNP (last 3 results)  Recent Labs  04/11/13 0156  PROBNP 4937.0*   CBG:  Recent Labs Lab 04/13/13 1740 04/13/13 2035 04/14/13 0042 04/14/13 0431 04/14/13 0849  GLUCAP 97 116* 103* 109* 118*        Signed:  Penny Pia  [facility] 04/16/2013, 11:59 AM

## 2013-04-16 NOTE — Progress Notes (Signed)
Morphine drip wasted in sink, 58 ml.  Witnessed by Terrance Massina Chaney, RN.  Peri MarisAndrew Makaylee Spielberg, MBA, BS, RN

## 2013-04-17 LAB — CULTURE, BLOOD (ROUTINE X 2): CULTURE: NO GROWTH

## 2013-04-28 DEATH — deceased

## 2015-05-04 IMAGING — CT CT HEAD W/O CM
2 series · 16 of 30 positions shown, 18 images · non-contrast
Comparison: None.

CLINICAL DATA: Altered mental status

EXAM:
CT HEAD WITHOUT CONTRAST
TECHNIQUE: Contiguous axial images were obtained from the base of the skull
through the vertex without intravenous contrast.

[Series 2: head w/o · axial · non-contrast · 0.43mm/px · z∈[+75,+180]mm · 8 of 28 slices shown, 10 images]
[im 4/28  brain]
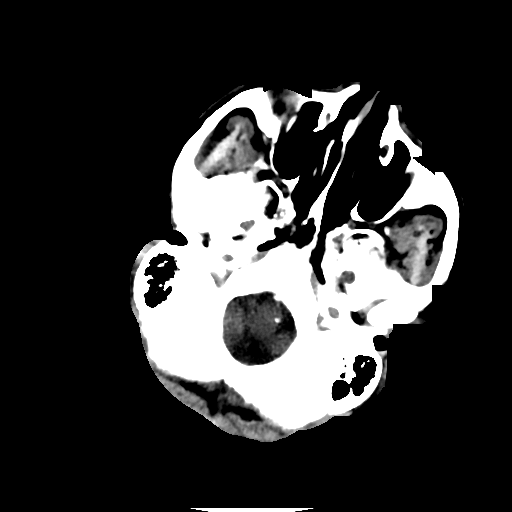
[im 4/28  bone]
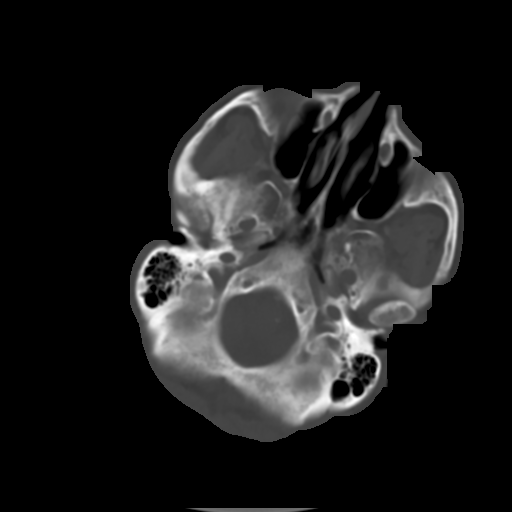
[im 7/28  brain]
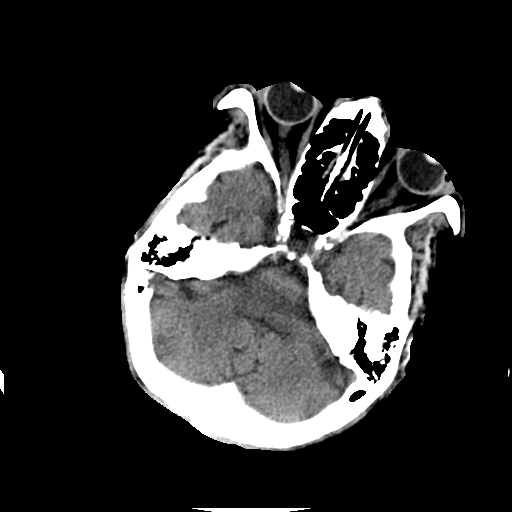
[im 10/28  brain]
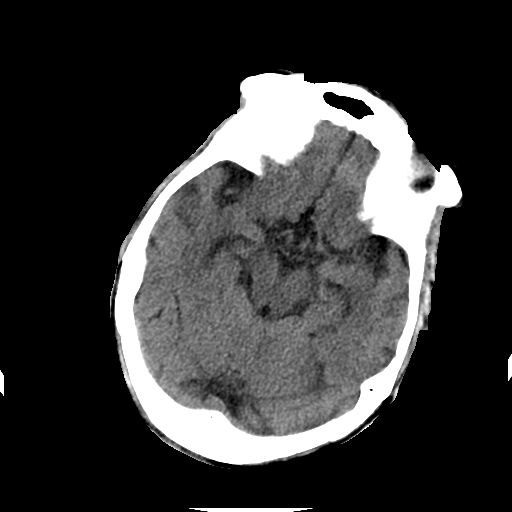
[im 13/28  brain]
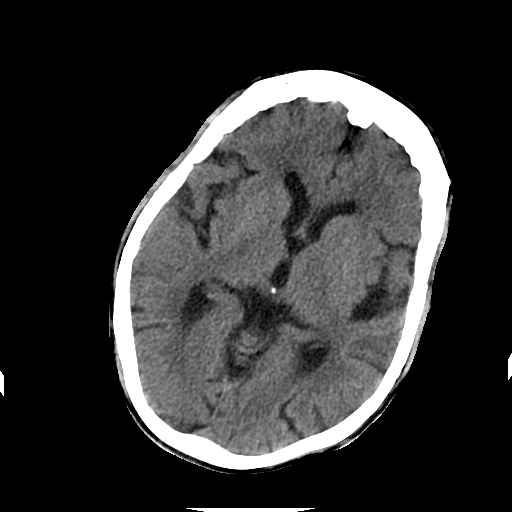
[im 16/28  brain]
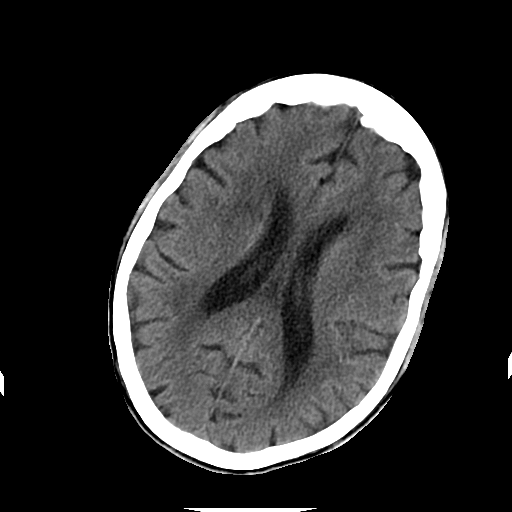
[im 16/28  bone]
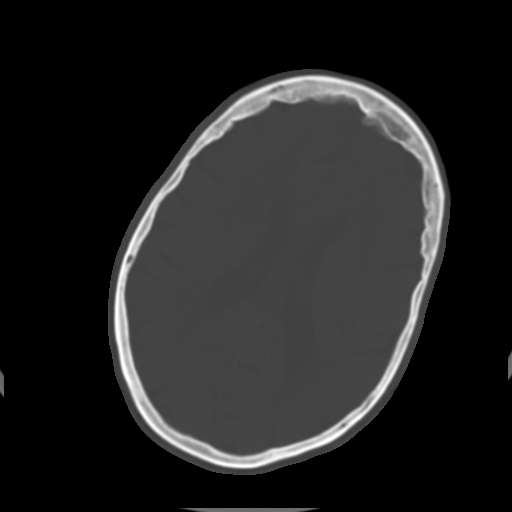
[im 19/28  brain]
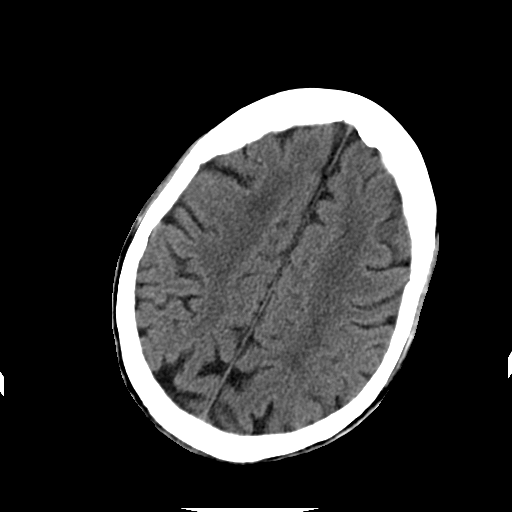
[im 22/28  brain]
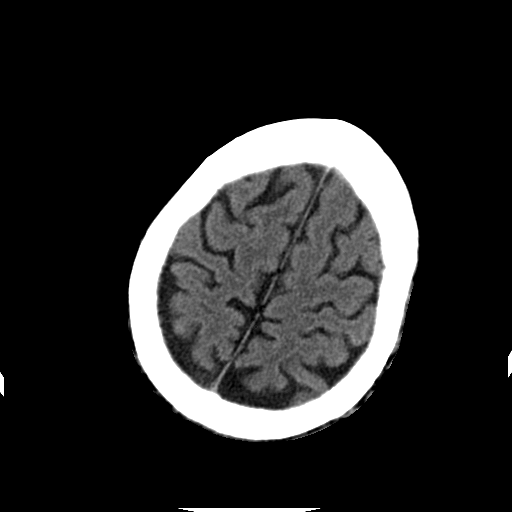
[im 25/28  brain]
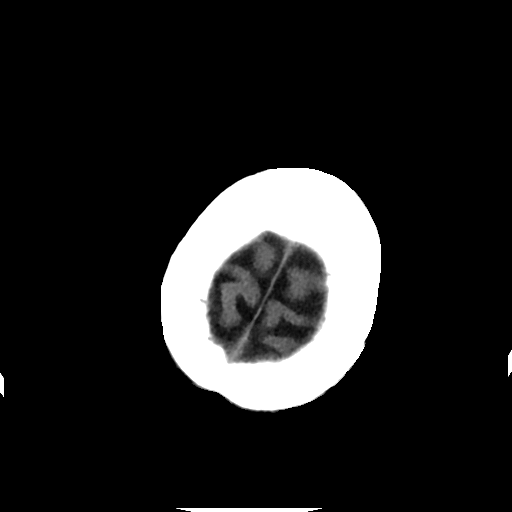

[Series 3: head w/o bone · axial · non-contrast · 0.43mm/px · z∈[+73,+180]mm · 8 of 55 slices shown]
[im 6/55  bone]
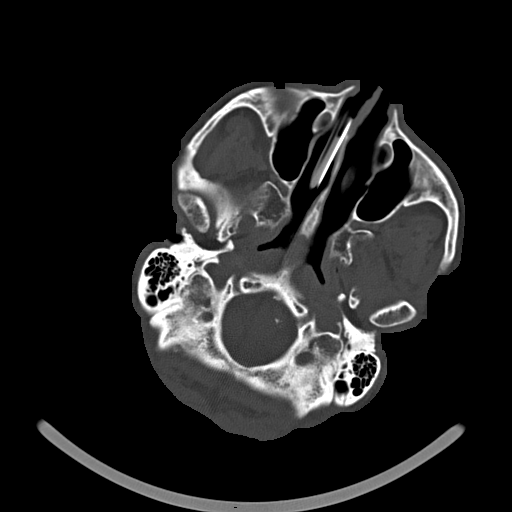
[im 12/55  bone]
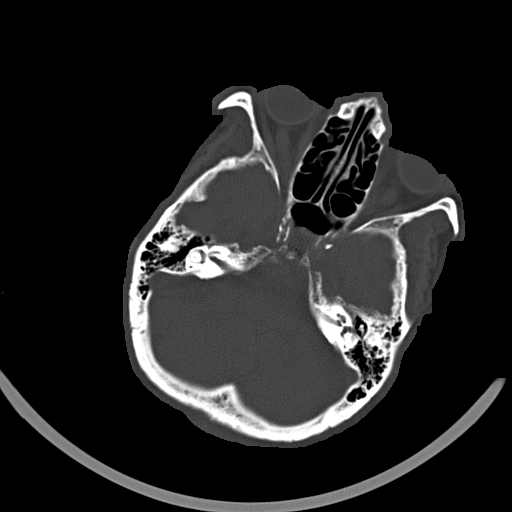
[im 18/55  bone]
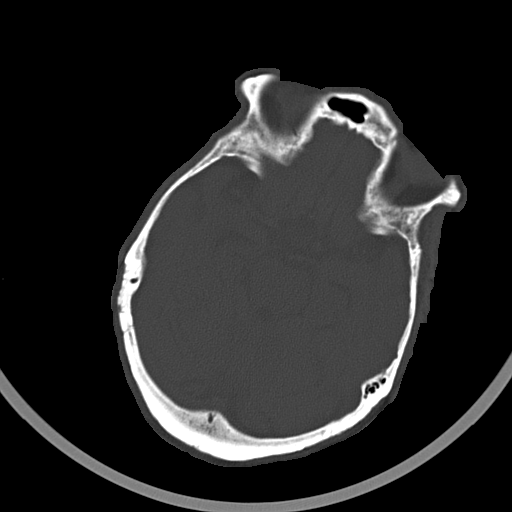
[im 23/55  bone]
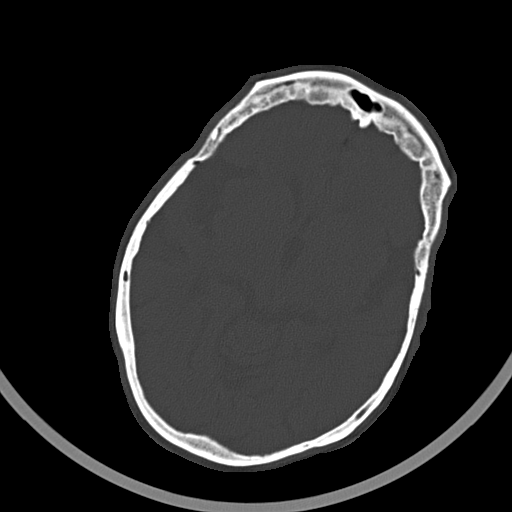
[im 32/55  bone]
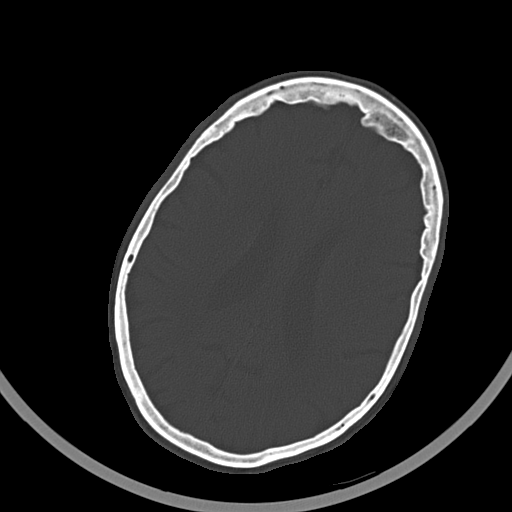
[im 37/55  bone]
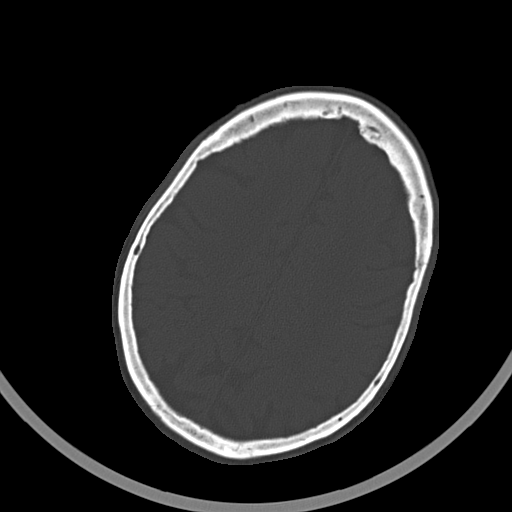
[im 43/55  bone]
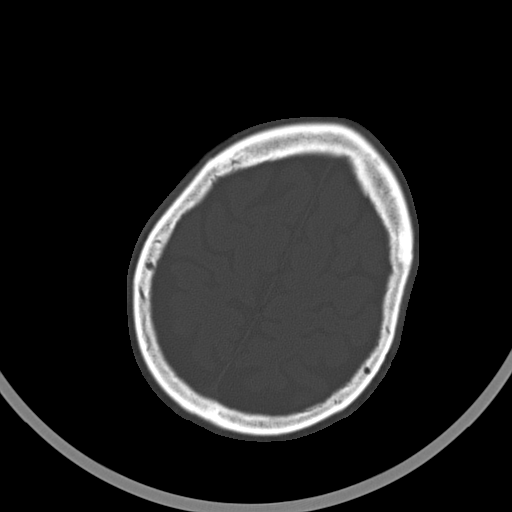
[im 49/55  bone]
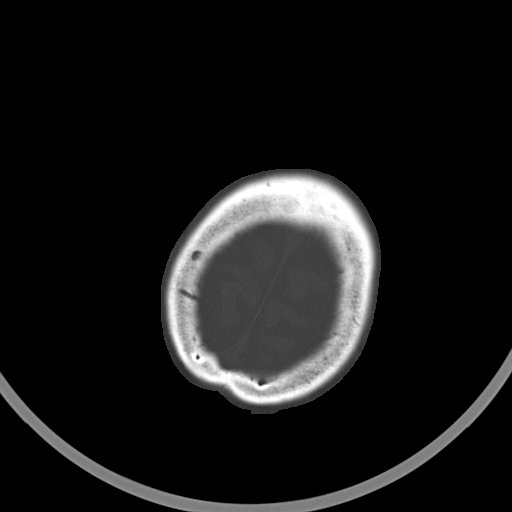

[16 of 30 positions shown; findings below may reference images not displayed]

FINDINGS: Mild atrophy. Negative for hydrocephalus. Mild chronic microvascular
ischemic change in the white matter.

Negative for acute infarct.  Negative for hemorrhage or mass.

NG tube in place. Paranasal sinuses are clear. No acute skull
abnormality.
IMPRESSION: No acute abnormality.

## 2015-05-04 IMAGING — CR DG CHEST 1V PORT
1 series · 1 of 1 positions shown · non-contrast
Comparison: None.

CLINICAL DATA: Right IJ central line placement

EXAM:
PORTABLE CHEST - 1 VIEW

[AP]
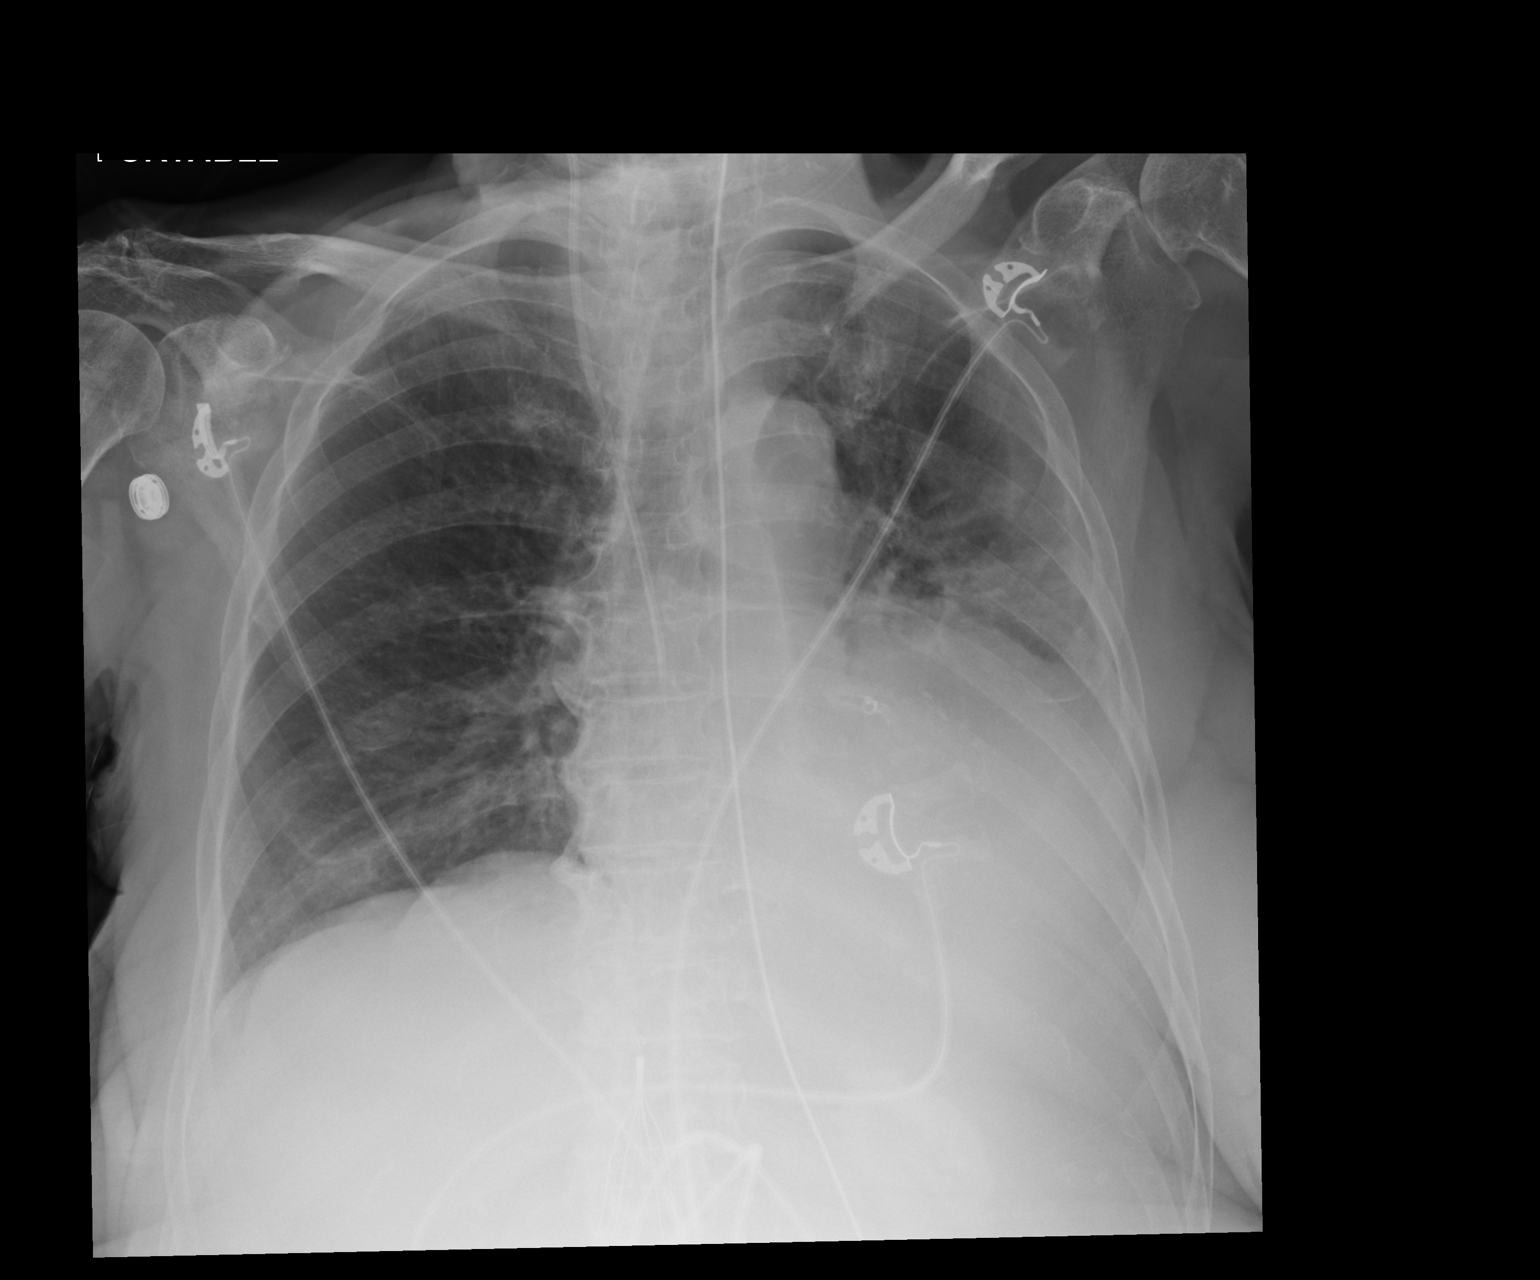

[1 of 1 positions shown; findings below may reference images not displayed]

FINDINGS: A right IJ central venous catheter is present. The tip is in good
position at the superior cavoatrial junction. There is dense
opacification of the left lung base as well as right-to-left shift
of the cardiac and mediastinal structures. The degree of shift is
likely slightly exaggerated by leftward rotation of the patient.
Layering left pleural effusion. The right lung is clear. No
pneumothorax. A nasogastric tube is present. The tip lies below the
field of view presumably within the stomach. An IVC filter is
identified in the right upper quadrant. Suspect super of renal
location. Enlarged cardiopericardial silhouette.
IMPRESSION: 1. Left pleural effusion and associated left basilar atelectasis.
The resultant volume loss results in right to left shift of the
cardiac and mediastinal structures.
2. The tip of the right IJ central venous catheter projects over the
superior cavoatrial junction.
3. The tip of the nasogastric tube lies off the field of view and is
presumably within the stomach.
4. Probable suprarenal IVC filter.
